# Patient Record
Sex: Male | Born: 1955
Health system: Southern US, Community
[De-identification: ages and names within clinical notes are randomized; demographics above are authoritative.]

## PROBLEM LIST (undated history)

## (undated) DIAGNOSIS — J449 Chronic obstructive pulmonary disease, unspecified: Secondary | ICD-10-CM

## (undated) DIAGNOSIS — K219 Gastro-esophageal reflux disease without esophagitis: Secondary | ICD-10-CM

## (undated) DIAGNOSIS — E785 Hyperlipidemia, unspecified: Secondary | ICD-10-CM

## (undated) HISTORY — DX: Chronic obstructive pulmonary disease, unspecified: J44.9

## (undated) HISTORY — DX: Gastro-esophageal reflux disease without esophagitis: K21.9

## (undated) HISTORY — DX: Hyperlipidemia, unspecified: E78.5

---

## 2000-09-20 ENCOUNTER — Encounter: Payer: Self-pay | Admitting: Family Medicine

## 2000-09-20 ENCOUNTER — Encounter: Admission: RE | Admit: 2000-09-20 | Discharge: 2000-09-20 | Payer: Self-pay | Admitting: Family Medicine

## 2005-08-28 ENCOUNTER — Ambulatory Visit: Payer: Self-pay | Admitting: Family Medicine

## 2006-09-12 ENCOUNTER — Emergency Department (HOSPITAL_COMMUNITY): Admission: EM | Admit: 2006-09-12 | Discharge: 2006-09-12 | Payer: Self-pay | Admitting: Family Medicine

## 2006-10-19 ENCOUNTER — Ambulatory Visit: Payer: Self-pay | Admitting: Family Medicine

## 2007-04-18 ENCOUNTER — Ambulatory Visit: Payer: Self-pay | Admitting: Family Medicine

## 2007-04-18 DIAGNOSIS — G4700 Insomnia, unspecified: Secondary | ICD-10-CM | POA: Insufficient documentation

## 2007-04-18 DIAGNOSIS — E785 Hyperlipidemia, unspecified: Secondary | ICD-10-CM | POA: Insufficient documentation

## 2007-10-13 ENCOUNTER — Emergency Department (HOSPITAL_COMMUNITY): Admission: EM | Admit: 2007-10-13 | Discharge: 2007-10-14 | Payer: Self-pay | Admitting: Infectious Diseases

## 2007-11-23 ENCOUNTER — Ambulatory Visit: Payer: Self-pay | Admitting: Family Medicine

## 2008-09-19 ENCOUNTER — Ambulatory Visit: Payer: Self-pay | Admitting: Family Medicine

## 2009-05-17 ENCOUNTER — Encounter: Payer: Self-pay | Admitting: Family Medicine

## 2010-05-29 NOTE — Medication Information (Signed)
Summary: Lipitor Approved/[facility]  Lipitor Approved/[facility]   Imported By: Sherian Rein 05/22/2009 08:30:58  _____________________________________________________________________  External Attachment:    Type:   Image     Comment:   External Document

## 2010-05-29 NOTE — Assessment & Plan Note (Signed)
Summary: COUGH//CCM   Vital Signs:  Patient profile:   55 year old male Weight:      139 pounds Temp:     98.3 degrees F oral Pulse rate:   87 / minute BP sitting:   94 / 68  (left arm) Cuff size:   regular  Vitals Entered By: Alfred Levins, CMA (Sep 19, 2008 2:23 PM) CC: laryngitis   History of Present Illness: 3 days of stuffy head, PND, ST, and coughing up yellow sputum. No fever.   Allergies (verified): No Known Drug Allergies  Past History:  Past Medical History: Reviewed history from 04/18/2007 and no changes required. Hyperlipidemia  Physical Exam  General:  Well-developed,well-nourished,in no acute distress; alert,appropriate and cooperative throughout examination Head:  Normocephalic and atraumatic without obvious abnormalities. No apparent alopecia or balding. Eyes:  No corneal or conjunctival inflammation noted. EOMI. Perrla. Funduscopic exam benign, without hemorrhages, exudates or papilledema. Vision grossly normal. Ears:  External ear exam shows no significant lesions or deformities.  Otoscopic examination reveals clear canals, tympanic membranes are intact bilaterally without bulging, retraction, inflammation or discharge. Hearing is grossly normal bilaterally. Nose:  External nasal examination shows no deformity or inflammation. Nasal mucosa are pink and moist without lesions or exudates. Mouth:  Oral mucosa and oropharynx without lesions or exudates.  Teeth in good repair. Neck:  No deformities, masses, or tenderness noted. Lungs:  Normal respiratory effort, chest expands symmetrically. Lungs are clear to auscultation, no crackles or wheezes.   Impression & Recommendations:  Problem # 1:  ACUTE BRONCHITIS (ICD-466.0)  His updated medication list for this problem includes:    Zithromax Z-pak 250 Mg Tabs (Azithromycin) .Marland Kitchen... As directed  Complete Medication List: 1)  Zantac 150 Mg Caps (Ranitidine hcl) .... As needed 2)  Levitra 20 Mg Tabs (Vardenafil  hcl) .... As needed 3)  Zithromax Z-pak 250 Mg Tabs (Azithromycin) .... As directed  Patient Instructions: 1)  Please schedule a follow-up appointment as needed .  Prescriptions: ZITHROMAX Z-PAK 250 MG TABS (AZITHROMYCIN) as directed  #1 x 0   Entered and Authorized by:   Nelwyn Salisbury MD   Signed by:   Nelwyn Salisbury MD on 09/19/2008   Method used:   Electronically to        [facility] [address] [phone]* (retail)       [address]       [place], [place]  [address]       Ph: [phone] or [phone]       Fax: [phone]   RxID:   [id number]

## 2010-05-29 NOTE — Assessment & Plan Note (Signed)
Summary: ?cough/njr   Vital Signs:  Patient Profile:   55 Years Old Male Weight:      142 pounds Temp:     98.5 degrees F oral Pulse rate:   86 / minute BP sitting:   110 / 64  (left arm) Cuff size:   regular  Vitals Entered By: Alfred Levins, CMA (November 23, 2007 4:37 PM)                 Chief Complaint:  cough and congestion.  History of Present Illness: Here for one month of HA, sinus pressure, PND, and ST. No fever or cough. Saw the nurse at work a few weeks ago, given Allegra and Amoxicillin. Is no better.    Current Allergies (reviewed today): No known allergies   Past Medical History:    Reviewed history from 04/18/2007 and no changes required:       Hyperlipidemia     Review of Systems      See HPI   Physical Exam  General:     Well-developed,well-nourished,in no acute distress; alert,appropriate and cooperative throughout examination Head:     Normocephalic and atraumatic without obvious abnormalities. No apparent alopecia or balding. Eyes:     No corneal or conjunctival inflammation noted. EOMI. Perrla. Funduscopic exam benign, without hemorrhages, exudates or papilledema. Vision grossly normal. Ears:     External ear exam shows no significant lesions or deformities.  Otoscopic examination reveals clear canals, tympanic membranes are intact bilaterally without bulging, retraction, inflammation or discharge. Hearing is grossly normal bilaterally. Nose:     External nasal examination shows no deformity or inflammation. Nasal mucosa are pink and moist without lesions or exudates. Mouth:     Oral mucosa and oropharynx without lesions or exudates.  Teeth in good repair. Neck:     No deformities, masses, or tenderness noted. Lungs:     Normal respiratory effort, chest expands symmetrically. Lungs are clear to auscultation, no crackles or wheezes.    Impression & Recommendations:  Problem # 1:  OTHER ACUTE SINUSITIS (ICD-461.8)  His updated medication  list for this problem includes:    Levaquin 500 Mg Tabs (Levofloxacin) ..... Once daily  Orders: Depo- Medrol 80mg  (J1040) Admin of Therapeutic Inj  intramuscular or subcutaneous ([address])   Complete Medication List: 1)  Zantac 150 Mg Caps (Ranitidine hcl) .... As needed 2)  Levaquin 500 Mg Tabs (Levofloxacin) .... Once daily 3)  Levitra 20 Mg Tabs (Vardenafil hcl) .... As needed   Patient Instructions: 1)  Please schedule a follow-up appointment as needed.   Prescriptions: LEVITRA 20 MG  TABS (VARDENAFIL HCL) as needed  #10 x 11   Entered and Authorized by:   Nelwyn Salisbury MD   Signed by:   Nelwyn Salisbury MD on 11/23/2007   Method used:   Electronically sent to ...       [facility] [address] [phone]*       [address]       [place], [place]  [address]       Ph: [phone] or [phone]       Fax: [phone]   RxID:   [id number] LEVAQUIN 500 MG  TABS (LEVOFLOXACIN) once daily  #10 x 0   Entered and Authorized by:   Nelwyn Salisbury MD   Signed by:   Nelwyn Salisbury MD on 11/23/2007   Method used:   Electronically sent to .[facility] Kitchen[facility] [address] [phone]*       [address]       [place], [place]  [address]       Ph: [phone] or [phone]       Fax: [phone]   RxID:   [id number]  ]  Medication Administration  Injection # 1:    Medication: Depo- Medrol 80mg     Diagnosis: OTHER ACUTE SINUSITIS (ICD-461.8)    Route: IM    Site: LUOQ gluteus    Exp Date: 9/10    Lot #: [id number]    Mfr: Sicor    Patient tolerated injection without complications    Given by: Alfred Levins, CMA (November 24, 2007 9:23 AM)  Orders Added: 1)  Est. Patient Level III [95638] 2)  Depo- Medrol 80mg  [J1040] 3)  Admin of Therapeutic Inj  intramuscular or subcutaneous [[address]643]

## 2010-06-21 ENCOUNTER — Ambulatory Visit (INDEPENDENT_AMBULATORY_CARE_PROVIDER_SITE_OTHER): Payer: PRIVATE HEALTH INSURANCE | Admitting: Internal Medicine

## 2010-06-21 ENCOUNTER — Encounter: Payer: Self-pay | Admitting: Internal Medicine

## 2010-06-21 DIAGNOSIS — R21 Rash and other nonspecific skin eruption: Secondary | ICD-10-CM

## 2010-06-24 NOTE — Assessment & Plan Note (Signed)
Summary: acute - rash // rs   Vital Signs:  Patient profile:   55 year old male Weight:      135 pounds Temp:     98.8 degrees F oral Pulse rate:   64 / minute Pulse rhythm:   regular BP sitting:   112 / 70  (right arm) Cuff size:   regular  Vitals Entered By: Lowella Petties CMA, AAMA (June 21, 2010 9:31 AM) CC: Itchy rash on legs and lower body x one month.   History of Present Illness: Here with wife  Has had some spots on his legs for a while had in the past very itchy   May have pimples that have some clear liquid come out No sig warmth but occ redness  Works driving forklift Uses regular soap  Had perhaps a year ago but not this bad  Allergies: No Known Drug Allergies  Past History:  Past medical, surgical, family and social histories (including risk factors) reviewed for relevance to current acute and chronic problems.  Past Medical History: Reviewed history from 04/18/2007 and no changes required. Hyperlipidemia  Past Surgical History: Reviewed history from 04/18/2007 and no changes required. Denies surgical history  Family History: Reviewed history from 04/18/2007 and no changes required. Family History Diabetes 1st degree relative Sister committed suicide Brother died of AIDS  Social History: Reviewed history from 04/18/2007 and no changes required. Married Current Smoker Alcohol use-no Drug use-no  Review of Systems       no fever No regular meds  Physical Exam  General:  alert and normal appearance.   Skin:  non specific macular or slightly raised areas on flexor thighs No true discrete lesions   Impression & Recommendations:  Problem # 1:  RASH AND OTHER NONSPECIFIC SKIN ERUPTION (ICD-782.1) Assessment New  seems most likely like neurodermatitis discussed moisturizing soap and creams atarax as needed and steroid cream  His updated medication list for this problem includes:    Triamcinolone Acetonide 0.1 % Crea  (Triamcinolone acetonide) .Marland Kitchen... Apply three times a day as needed for itching  Complete Medication List: 1)  Zantac 150 Mg Caps (Ranitidine hcl) .... As needed 2)  Levitra 20 Mg Tabs (Vardenafil hcl) .... As needed 3)  Hydroxyzine Hcl 25 Mg Tabs (Hydroxyzine hcl) .Marland Kitchen.. 1 tab by mouth three times a day as needed for itching 4)  Triamcinolone Acetonide 0.1 % Crea (Triamcinolone acetonide) .... Apply three times a day as needed for itching  Patient Instructions: 1)  Please use moisturizing soap and then a good cream after showering like cerave or cetaphil 2)  Try the cream and pills for itching (pills are mostly for at bedtime) 3)  Call Dr Clent Ridges if rash not better Prescriptions: TRIAMCINOLONE ACETONIDE 0.1 % CREA (TRIAMCINOLONE ACETONIDE) apply three times a day as needed for itching  #60gm x 0   Entered and Authorized by:   Cindee Salt MD   Signed by:   Cindee Salt MD on 06/21/2010   Method used:   Electronically to        [facility]  [facility] [address] [facility] [address] [phone]* (retail)       [address]       [place], [place]  [address]       Ph: [phone] or [phone]       Fax: [phone]   RxID:   [id number] HYDROXYZINE HCL 25 MG TABS (HYDROXYZINE HCL) 1 tab by mouth three times a  day as needed for itching  #60 x 0   Entered and Authorized by:   Cindee Salt MD   Signed by:   Cindee Salt MD on 06/21/2010   Method used:   Electronically to        [facility]  [facility] [phone]* (retail)       [address]       [place], [place]  [address]       Ph: [phone] or [phone]       Fax: [phone]   RxID:   [id number]    Orders Added: 1)  Est. Patient Level III [63[address]]

## 2010-09-19 ENCOUNTER — Emergency Department (HOSPITAL_COMMUNITY)
Admission: EM | Admit: 2010-09-19 | Discharge: 2010-09-20 | Disposition: A | Discharge status: Home / Self Care | Payer: PRIVATE HEALTH INSURANCE | Attending: Emergency Medicine | Admitting: Emergency Medicine

## 2010-09-19 DIAGNOSIS — L509 Urticaria, unspecified: Secondary | ICD-10-CM | POA: Insufficient documentation

## 2010-09-19 DIAGNOSIS — R21 Rash and other nonspecific skin eruption: Secondary | ICD-10-CM | POA: Insufficient documentation

## 2010-09-24 ENCOUNTER — Ambulatory Visit (INDEPENDENT_AMBULATORY_CARE_PROVIDER_SITE_OTHER): Payer: PRIVATE HEALTH INSURANCE | Admitting: Family Medicine

## 2010-09-24 ENCOUNTER — Encounter: Payer: Self-pay | Admitting: Family Medicine

## 2010-09-24 VITALS — BP 116/82 | HR 88 | Temp 98.1°F | Resp 16 | Wt 135.5 lb

## 2010-09-24 DIAGNOSIS — R21 Rash and other nonspecific skin eruption: Secondary | ICD-10-CM

## 2010-09-24 MED ORDER — PREDNISONE 10 MG PO TABS: 20.0000 mg | ORAL_TABLET | Freq: Two times a day (BID) | ORAL | Status: AC

## 2010-09-24 MED ORDER — TADALAFIL 20 MG PO TABS: 20.0000 mg | ORAL_TABLET | Freq: Every day | ORAL | Status: DC | PRN

## 2010-09-24 NOTE — Progress Notes (Signed)
  Subjective:    Patient ID: Guy Carlson, male    DOB: 02/01/1956, 55 y.o.   MRN: [id number]  HPI Here for 4 months of a diffuse itchy rash over the trunk and legs. He has tried topical steroid creams and Hydroxyzine, but he has had little improvement. No scalp or face involvement.    Review of Systems  Constitutional: Negative.   Skin: Positive for rash.       Objective:   Physical Exam  Constitutional: He appears well-developed and well-nourished.  Skin:       Widespread patches of macular erythema on trunk and legs. No scaling or plaquing.           Assessment & Plan:  Rash of uncertain etiology. Possiblities include psoriasis or lupus. Try oral Prednisone for 2 weeks and we will follow up then. He may need referral to Dermatology

## 2010-09-26 ENCOUNTER — Telehealth: Payer: Self-pay | Admitting: *Deleted

## 2010-09-26 NOTE — Telephone Encounter (Signed)
Tried to return wife's call about patient's rash.  However there is no answering machine at the home number. The patient's cell number does not except messages and the work number is not correct.

## 2011-01-22 LAB — URINALYSIS, ROUTINE W REFLEX MICROSCOPIC
Bilirubin Urine: NEGATIVE
Glucose, UA: NEGATIVE
Hgb urine dipstick: NEGATIVE
Ketones, ur: NEGATIVE
Nitrite: NEGATIVE
Protein, ur: NEGATIVE
Specific Gravity, Urine: 1.018
Urobilinogen, UA: 1
pH: 6.5

## 2011-01-22 LAB — POCT I-STAT, CHEM 8
BUN: 16
Calcium, Ion: 1.12
Chloride: 105
Creatinine, Ser: 1.4
Glucose, Bld: 90
HCT: 41
Hemoglobin: 13.9
Potassium: 3.9
Sodium: 140
TCO2: 26

## 2011-01-22 LAB — POCT CARDIAC MARKERS
CKMB, poc: 1.3
Myoglobin, poc: 174
Operator id: 272551
Troponin i, poc: <0.05

## 2011-06-22 ENCOUNTER — Ambulatory Visit (INDEPENDENT_AMBULATORY_CARE_PROVIDER_SITE_OTHER): Payer: PRIVATE HEALTH INSURANCE | Admitting: Family Medicine

## 2011-06-22 ENCOUNTER — Encounter: Payer: Self-pay | Admitting: Family Medicine

## 2011-06-22 VITALS — BP 110/70 | HR 106 | Temp 99.2°F | Wt 133.0 lb

## 2011-06-22 DIAGNOSIS — J40 Bronchitis, not specified as acute or chronic: Secondary | ICD-10-CM

## 2011-06-22 MED ORDER — BENZONATATE 100 MG PO CAPS: 100.0000 mg | ORAL_CAPSULE | Freq: Four times a day (QID) | ORAL | Status: AC | PRN

## 2011-06-22 MED ORDER — DOXYCYCLINE HYCLATE 100 MG PO CAPS: 100.0000 mg | ORAL_CAPSULE | Freq: Two times a day (BID) | ORAL | Status: DC

## 2011-06-22 NOTE — Progress Notes (Signed)
  Subjective:    Patient ID: Guy Carlson, male    DOB: 01-11-1956, 56 y.o.   MRN: [id number]  HPI Here for one week of stuffy head, PND, and coughing up green sputum.    Review of Systems  Constitutional: Negative.   HENT: Positive for congestion and postnasal drip.   Eyes: Negative.   Respiratory: Positive for cough.        Objective:   Physical Exam  Constitutional: He appears well-developed and well-nourished.  HENT:  Right Ear: External ear normal.  Left Ear: External ear normal.  Nose: Nose normal.  Mouth/Throat: Oropharynx is clear and moist. No oropharyngeal exudate.  Eyes: Conjunctivae are normal.  Neck: No thyromegaly present.  Pulmonary/Chest: Effort normal. He has no wheezes. He has no rales.       Scattered rhonchi   Lymphadenopathy:    He has no cervical adenopathy.          Assessment & Plan:  Add Mucinex

## 2011-06-24 ENCOUNTER — Telehealth: Payer: Self-pay | Admitting: Family Medicine

## 2011-06-24 NOTE — Telephone Encounter (Signed)
Patient states he came in on Monday and has been out of work since and work like a return to work letter so that he may return to work on tomorrow 06/25/11. Please call patient for pick up.

## 2011-06-24 NOTE — Telephone Encounter (Signed)
Pls advise.  

## 2011-06-24 NOTE — Telephone Encounter (Signed)
Please give him such a note  

## 2011-06-25 NOTE — Telephone Encounter (Signed)
Pt called to check on status of getting letter for his work for being out for 3 days. Pt is req that this letter be faxed to his work, [employer] in [employer].  Pts work fax # is [phone] attn Zena Amos. This is the pts supervisor.

## 2011-06-25 NOTE — Telephone Encounter (Signed)
Work note is ready for pick up, tried to call pt, no answer.

## 2012-04-11 ENCOUNTER — Ambulatory Visit (INDEPENDENT_AMBULATORY_CARE_PROVIDER_SITE_OTHER): Payer: PRIVATE HEALTH INSURANCE | Admitting: Family Medicine

## 2012-04-11 ENCOUNTER — Encounter: Payer: Self-pay | Admitting: Family Medicine

## 2012-04-11 VITALS — BP 114/66 | HR 103 | Temp 98.3°F | Wt 130.0 lb

## 2012-04-11 DIAGNOSIS — J329 Chronic sinusitis, unspecified: Secondary | ICD-10-CM

## 2012-04-11 DIAGNOSIS — L84 Corns and callosities: Secondary | ICD-10-CM

## 2012-04-11 DIAGNOSIS — M21619 Bunion of unspecified foot: Secondary | ICD-10-CM

## 2012-04-11 MED ORDER — DOXYCYCLINE HYCLATE 100 MG PO CAPS: 100.0000 mg | ORAL_CAPSULE | Freq: Two times a day (BID) | ORAL | Status: AC

## 2012-04-11 NOTE — Progress Notes (Signed)
  Subjective:    Patient ID: Guy Carlson., male    DOB: 10-Aug-1955, 56 y.o.   MRN: [id number]  HPI Here for 2 things. First he has had a painful lump on the side of his left foot for about a year. He gets a large callous over the area. He usually wears athletic shoes. Also he has had sinus pressure for a week, with HA, PND, and blowing green mucus form the nose. No fever.    Review of Systems  Constitutional: Negative.   HENT: Positive for congestion, postnasal drip and sinus pressure.   Eyes: Negative.   Respiratory: Negative.        Objective:   Physical Exam  Constitutional: He appears well-developed and well-nourished.  HENT:  Left Ear: External ear normal.  Nose: Nose normal.  Mouth/Throat: Oropharynx is clear and moist.  Eyes: Conjunctivae normal are normal.  Pulmonary/Chest: Effort normal and breath sounds normal.  Musculoskeletal:       There is a tender bony lump on the lateral left foot with a large callous over it  Lymphadenopathy:    He has no cervical adenopathy.          Assessment & Plan:  Given Doxycycline for the sinus infection. Use a pumice stone and warm soaks to soften up the callous. We will refer to Podiatry to remove the bunion.

## 2012-06-03 ENCOUNTER — Encounter: Payer: Self-pay | Admitting: Family Medicine

## 2012-06-03 ENCOUNTER — Ambulatory Visit (INDEPENDENT_AMBULATORY_CARE_PROVIDER_SITE_OTHER): Payer: PRIVATE HEALTH INSURANCE | Admitting: Family Medicine

## 2012-06-03 VITALS — BP 120/80 | HR 88 | Temp 98.1°F | Resp 18 | Wt 130.0 lb

## 2012-06-03 DIAGNOSIS — R634 Abnormal weight loss: Secondary | ICD-10-CM

## 2012-06-03 DIAGNOSIS — R109 Unspecified abdominal pain: Secondary | ICD-10-CM

## 2012-06-03 DIAGNOSIS — R0602 Shortness of breath: Secondary | ICD-10-CM

## 2012-06-03 LAB — POCT URINALYSIS DIPSTICK
Bilirubin, UA: NEGATIVE
Blood, UA: NEGATIVE
Glucose, UA: NEGATIVE
Ketones, UA: NEGATIVE
Leukocytes, UA: NEGATIVE
Nitrite, UA: NEGATIVE
Protein, UA: NEGATIVE
Spec Grav, UA: 1.02
Urobilinogen, UA: 0.2
pH, UA: 7

## 2012-06-03 LAB — CBC WITH DIFFERENTIAL/PLATELET
Basophils Absolute: 0.1 10*3/uL (ref 0.0–0.1)
Basophils Relative: 1 % (ref 0–1)
Eosinophils Absolute: 0.3 10*3/uL (ref 0.0–0.7)
Eosinophils Relative: 4 % (ref 0–5)
HCT: 36.7 % — ABNORMAL LOW (ref 39.0–52.0)
Hemoglobin: 12 g/dL — ABNORMAL LOW (ref 13.0–17.0)
Lymphocytes Relative: 30 % (ref 12–46)
Lymphs Abs: 2.1 10*3/uL (ref 0.7–4.0)
MCH: 30.1 pg (ref 26.0–34.0)
MCHC: 32.7 g/dL (ref 30.0–36.0)
MCV: 92 fL (ref 78.0–100.0)
Monocytes Absolute: 0.4 10*3/uL (ref 0.1–1.0)
Monocytes Relative: 6 % (ref 3–12)
Neutro Abs: 4 10*3/uL (ref 1.7–7.7)
Neutrophils Relative %: 59 % (ref 43–77)
Platelets: 203 10*3/uL (ref 150–400)
RBC: 3.99 MIL/uL — ABNORMAL LOW (ref 4.22–5.81)
RDW: 13.2 % (ref 11.5–15.5)
WBC: 6.8 10*3/uL (ref 4.0–10.5)

## 2012-06-03 LAB — BASIC METABOLIC PANEL
BUN: 9 mg/dL (ref 6–23)
CO2: 31 mEq/L (ref 19–32)
Calcium: 9.4 mg/dL (ref 8.4–10.5)
Chloride: 106 mEq/L (ref 96–112)
Creat: 0.82 mg/dL (ref 0.50–1.35)
Glucose, Bld: 92 mg/dL (ref 70–99)
Potassium: 4.1 mEq/L (ref 3.5–5.3)
Sodium: 140 mEq/L (ref 135–145)

## 2012-06-03 LAB — HEPATIC FUNCTION PANEL
ALT: 11 U/L (ref 0–53)
AST: 17 U/L (ref 0–37)
Albumin: 3.9 g/dL (ref 3.5–5.2)
Alkaline Phosphatase: 63 U/L (ref 39–117)
Bilirubin, Direct: 0.1 mg/dL (ref 0.0–0.3)
Indirect Bilirubin: 0.3 mg/dL (ref 0.0–0.9)
Total Bilirubin: 0.4 mg/dL (ref 0.3–1.2)
Total Protein: 6.2 g/dL (ref 6.0–8.3)

## 2012-06-03 LAB — TSH: TSH: 2.011 u[IU]/mL (ref 0.350–4.500)

## 2012-06-03 LAB — PSA: PSA: 0.76 ng/mL (ref <=4.00)

## 2012-06-03 NOTE — Progress Notes (Signed)
  Subjective:    Patient ID: Guy Koskela., male    DOB: 06-07-1955, 57 y.o.   MRN: [id number]  HPI Here with his wife for a number of concerns. He as usual downplays everything but his wife is very worried. He has lost weight in the past year without trying. He continues to smoke between 1 and 2 ppd of cigarettes. He denies using any alcohol for many years. He has had frequent diarrhea over the last month or so. He has transient abdominal pains and nausea, though he has not vomited. He has night sweats where he soaks the bed with sweat. He has a chronic dry cough and mild SOB. He has never seen blood or black in his stools. His appetite is poor. He averages 3-4 hours of sleep a night saying he doesn't feel sleepy. He has not had much medical surveillance for years.    Review of Systems  Constitutional: Positive for chills, diaphoresis, appetite change and unexpected weight change. Negative for activity change.  Eyes: Negative.   Respiratory: Positive for cough, chest tightness, shortness of breath and wheezing.   Cardiovascular: Negative.   Gastrointestinal: Positive for nausea, abdominal pain and diarrhea. Negative for vomiting, constipation, blood in stool, abdominal distention and rectal pain.  Genitourinary: Negative.   Neurological: Negative.        Objective:   Physical Exam  Constitutional: He is oriented to person, place, and time. No distress.       Quite thin   Eyes: Conjunctivae normal are normal. Pupils are equal, round, and reactive to light. No scleral icterus.  Neck: No thyromegaly present.  Cardiovascular: Normal rate, regular rhythm, normal heart sounds and intact distal pulses.   Pulmonary/Chest: Effort normal. No respiratory distress. He has no rales.       Scattered rhonchi and wheezes   Abdominal: Soft. Bowel sounds are normal. He exhibits no distension and no mass. There is no tenderness. There is no rebound and no guarding.  Musculoskeletal: He exhibits  no edema.  Lymphadenopathy:    He has no cervical adenopathy.  Neurological: He is alert and oriented to person, place, and time.          Assessment & Plan:  Unexplained weight loss, night sweats, abdominal pains and diarrhea. Possible etiologies include peptic ulcers, coilitis, neoplasm, etc. Get a CXR and basic labs today. Set up referral to GI for possible upper and lower endoscopy. I strongly urged him to quit smoking.

## 2012-06-06 ENCOUNTER — Ambulatory Visit (INDEPENDENT_AMBULATORY_CARE_PROVIDER_SITE_OTHER)
Admission: RE | Admit: 2012-06-06 | Discharge: 2012-06-06 | Disposition: A | Discharge status: Home / Self Care | Payer: PRIVATE HEALTH INSURANCE | Source: Ambulatory Visit | Attending: Family Medicine | Admitting: Family Medicine

## 2012-06-06 ENCOUNTER — Encounter: Payer: Self-pay | Admitting: *Deleted

## 2012-06-06 DIAGNOSIS — R0602 Shortness of breath: Secondary | ICD-10-CM

## 2012-06-06 NOTE — Progress Notes (Signed)
Quick Note:  I left voice message for pt to return my call to get results. ______

## 2012-06-07 ENCOUNTER — Telehealth: Payer: Self-pay | Admitting: Family Medicine

## 2012-06-07 NOTE — Telephone Encounter (Signed)
Pt wife is requesting xray results. Ok to leave on vm

## 2012-06-07 NOTE — Telephone Encounter (Signed)
I did call and leave results on voice mail.

## 2012-06-07 NOTE — Progress Notes (Signed)
Quick Note:  I left voice message with results. ______ 

## 2012-06-07 NOTE — Progress Notes (Signed)
Quick Note:  Per pt, we can leave the results on voice mail, which I did. ______

## 2012-06-11 ENCOUNTER — Other Ambulatory Visit: Payer: Self-pay

## 2012-06-20 ENCOUNTER — Encounter: Payer: Self-pay | Admitting: Gastroenterology

## 2012-07-07 ENCOUNTER — Ambulatory Visit (INDEPENDENT_AMBULATORY_CARE_PROVIDER_SITE_OTHER): Payer: PRIVATE HEALTH INSURANCE | Admitting: Gastroenterology

## 2012-07-07 ENCOUNTER — Encounter: Payer: Self-pay | Admitting: Gastroenterology

## 2012-07-07 VITALS — BP 108/62 | HR 68 | Ht 71.0 in | Wt 138.0 lb

## 2012-07-07 DIAGNOSIS — R634 Abnormal weight loss: Secondary | ICD-10-CM

## 2012-07-07 DIAGNOSIS — D649 Anemia, unspecified: Secondary | ICD-10-CM

## 2012-07-07 MED ORDER — PEG-KCL-NACL-NASULF-NA ASC-C 100 G PO SOLR: 1.0000 | Freq: Once | ORAL | Status: DC

## 2012-07-07 NOTE — Patient Instructions (Addendum)
You have been scheduled for an endoscopy and colonoscopy with propofol. Please follow the written instructions given to you at your visit today. Please pick up your prep at the pharmacy within the next 1-3 days. If you use inhalers (even only as needed), please bring them with you on the day of your procedure.  Follow instructions on Hemoccult cards and mail them back to [place] when finished.   Thank you for choosing me and [facility].  Venita Lick. Pleas Koch., MD., Clementeen Graham

## 2012-07-07 NOTE — Progress Notes (Signed)
History of Present Illness: This is a 57 year old male accompanied by his wife. He has had about a 7 or 8 pound weight loss over the past several months with a decreased appetite. He was recently diagnosed with COPD and quit cigarette smoking 2-3 weeks ago. He states his appetite has improved since he quit smoking and has gained several pounds. He relates some slightly loose stools and mild abdominal discomfort that occurred for a few days several weeks ago but he currently denies any gastrointestinal complaints. He was noted to have a mild normocytic anemia at his last office visit with Dr. Clent Ridges and he was placed on iron supplements. An upper GI series performed in 2002 revealed duodenal bulb deformity consistent with prior ulcer disease and possible gastritis noted to be thickened gastric folds. Denies abdominal pain, constipation, diarrhea, change in stool caliber, melena, hematochezia, nausea, vomiting, dysphagia, reflux symptoms, chest pain.  Review of Systems: Pertinent positive and negative review of systems were noted in the above HPI section. All other review of systems were otherwise negative.  Current Medications, Allergies, Past Medical History, Past Surgical History, Family History and Social History were reviewed in [employer] record.  Physical Exam: General: Well developed , well nourished, no acute distress Head: Normocephalic and atraumatic Eyes:  sclerae anicteric, EOMI Ears: Normal auditory acuity Mouth: No deformity or lesions Neck: Supple, no masses or thyromegaly Lungs: Clear throughout to auscultation Heart: Regular rate and rhythm; no murmurs, rubs or bruits Abdomen: Soft, non tender and non distended. No masses, hepatosplenomegaly or hernias noted. Normal Bowel sounds Rectal: Deferred to colonoscopy Musculoskeletal: Symmetrical with no gross deformities  Skin: No lesions on visible extremities Pulses:  Normal pulses noted Extremities: No clubbing,  cyanosis, edema or deformities noted Neurological: Alert oriented x 4, grossly nonfocal Cervical Nodes:  No significant cervical adenopathy Inguinal Nodes: No significant inguinal adenopathy Psychological:  Alert and cooperative. Normal mood and affect  Assessment and Recommendations:  1. Weight loss, change in appetite, unexplained anemia, possible history of ulcer disease. Rule out colorectal neoplasm, ulcer and other gastrointestinal disorders. Obtain stool Hemoccults. Schedule colonoscopy and upper endoscopy. The risks, benefits, and alternatives to endoscopy with possible biopsy and possible dilation were discussed with the patient and they consent to proceed. The risks, benefits, and alternatives to colonoscopy with possible biopsy and possible polypectomy were discussed with the patient and they consent to proceed.

## 2012-07-18 ENCOUNTER — Encounter: Payer: Self-pay | Admitting: Gastroenterology

## 2012-08-02 ENCOUNTER — Other Ambulatory Visit: Payer: Self-pay | Admitting: Gastroenterology

## 2012-08-02 ENCOUNTER — Encounter: Payer: Self-pay | Admitting: Gastroenterology

## 2012-08-02 ENCOUNTER — Ambulatory Visit (AMBULATORY_SURGERY_CENTER): Payer: PRIVATE HEALTH INSURANCE | Admitting: Gastroenterology

## 2012-08-02 VITALS — BP 101/71 | HR 75 | Temp 98.2°F | Resp 18 | Ht 71.0 in | Wt 138.0 lb

## 2012-08-02 DIAGNOSIS — R634 Abnormal weight loss: Secondary | ICD-10-CM

## 2012-08-02 DIAGNOSIS — D649 Anemia, unspecified: Secondary | ICD-10-CM

## 2012-08-02 DIAGNOSIS — K2980 Duodenitis without bleeding: Secondary | ICD-10-CM

## 2012-08-02 DIAGNOSIS — D126 Benign neoplasm of colon, unspecified: Secondary | ICD-10-CM

## 2012-08-02 MED ORDER — OMEPRAZOLE 40 MG PO CPDR: 40.0000 mg | DELAYED_RELEASE_CAPSULE | Freq: Every day | ORAL | Status: DC

## 2012-08-02 MED ORDER — SODIUM CHLORIDE 0.9 % IV SOLN: 500.0000 mL | INTRAVENOUS | Status: DC

## 2012-08-02 NOTE — Progress Notes (Signed)
Called to room to assist during endoscopic procedure.  Patient ID and intended procedure confirmed with present staff. Received instructions for my participation in the procedure from the performing physician.  

## 2012-08-02 NOTE — Op Note (Signed)
[facility] [address] [place], [address]   COLONOSCOPY PROCEDURE REPORT  PATIENT: Guy Carlson, Guy Carlson  MR#: [id number] BIRTHDATE: 1955/06/29 , 56  yrs. old GENDER: Male ENDOSCOPIST: Meryl Dare, MD, [facility] REFERRED XB:[id number] Marguerita Beards, M.D. PROCEDURE DATE:  08/02/2012 PROCEDURE:   Colonoscopy with snare polypectomy ASA CLASS:   Class II INDICATIONS:Anemia, non-specific and weight loss. MEDICATIONS: MAC sedation, administered by CRNA and propofol (Diprivan) 200mg  IV DESCRIPTION OF PROCEDURE:   After the risks benefits and alternatives of the procedure were thoroughly explained, informed consent was obtained.  A digital rectal exam revealed no abnormalities of the rectum.   The LB [id number]  endoscope was introduced through the anus and advanced to the cecum, which was identified by both the appendix and ileocecal valve. No adverse events experienced.   The quality of the prep was good, using MoviPrep  The instrument was then slowly withdrawn as the colon was fully examined.  COLON FINDINGS: 4 mm  non-bleeding angiodysplastic lesion was found at the cecum.   A pedunculated polyp measuring 1 cm in size was found in the sigmoid colon.  A polypectomy was performed using snare cautery.  The resection was complete and the polyp tissue was completely retrieved.   Moderate diverticulosis was noted in the sigmoid colon and descending colon.   The colon was otherwise normal.  There was no diverticulosis, inflammation, polyps or cancers unless previously stated.  Retroflexed views revealed small internal hemorrhoids. The time to cecum=3 minutes 20 seconds. Withdrawal time=10 minutes 14 seconds.  The scope was withdrawn and the procedure completed. COMPLICATIONS: There were no complications.  ENDOSCOPIC IMPRESSION: 1.   4mm angiodysplastic lesion and at the cecum 2.   Pedunculated polyp measuring 1 cm in the sigmoid colon: polypectomy performed using  snare cautery 3.   Moderate diverticulosis was noted in the sigmoid colon and descending colon 4.   Small internal hemorrhoids  RECOMMENDATIONS: 1.  Hold aspirin, aspirin products, and anti-inflammatory medication for 2 weeks. 2.  Await pathology results 3.  High fiber diet with liberal fluid intake. 4.  Repeat colonoscopy in 5 years if polyp adenomatous; otherwise 10 years   eSigned:  Meryl Dare, MD, [facility] 08/02/2012 2:33 PM      PATIENT NAME:  [name], [name] MR#: [id number]

## 2012-08-02 NOTE — Patient Instructions (Addendum)
YOU HAD AN ENDOSCOPIC PROCEDURE TODAY AT THE [facility]: Refer to the procedure report that was given to you for any specific questions about what was found during the examination.  If the procedure report does not answer your questions, please call your gastroenterologist to clarify.  If you requested that your care partner not be given the details of your procedure findings, then the procedure report has been included in a sealed envelope for you to review at your convenience later.  YOU SHOULD EXPECT: Some feelings of bloating in the abdomen. Passage of more gas than usual.  Walking can help get rid of the air that was put into your GI tract during the procedure and reduce the bloating. If you had a lower endoscopy (such as a colonoscopy or flexible sigmoidoscopy) you may notice spotting of blood in your stool or on the toilet paper. If you underwent a bowel prep for your procedure, then you may not have a normal bowel movement for a few days.  DIET: Your first meal following the procedure should be a light meal and then it is ok to progress to your normal diet.  A half-sandwich or bowl of soup is an example of a good first meal.  Heavy or fried foods are harder to digest and may make you feel nauseous or bloated.  Likewise meals heavy in dairy and vegetables can cause extra gas to form and this can also increase the bloating.  Drink plenty of fluids but you should avoid alcoholic beverages for 24 hours.  ACTIVITY: Your care partner should take you home directly after the procedure.  You should plan to take it easy, moving slowly for the rest of the day.  You can resume normal activity the day after the procedure however you should NOT DRIVE or use heavy machinery for 24 hours (because of the sedation medicines used during the test).    SYMPTOMS TO REPORT IMMEDIATELY: A gastroenterologist can be reached at any hour.  During normal business hours, 8:30 AM to 5:00 PM Monday through Friday,  call [phone].  After hours and on weekends, please call the GI answering service at [phone] who will take a message and have the physician on call contact you.   Following lower endoscopy (colonoscopy or flexible sigmoidoscopy):  Excessive amounts of blood in the stool  Significant tenderness or worsening of abdominal pains  Swelling of the abdomen that is new, acute  Fever of 100F or higher  Following upper endoscopy (EGD)  Vomiting of blood or coffee ground material  New chest pain or pain under the shoulder blades  Painful or persistently difficult swallowing  New shortness of breath  Fever of 100F or higher  Black, tarry-looking stools  FOLLOW UP: If any biopsies were taken you will be contacted by phone or by letter within the next 1-3 weeks.  Call your gastroenterologist if you have not heard about the biopsies in 3 weeks.  Our staff will call the home number listed on your records the next business day following your procedure to check on you and address any questions or concerns that you may have at that time regarding the information given to you following your procedure. This is a courtesy call and so if there is no answer at the home number and we have not heard from you through the emergency physician on call, we will assume that you have returned to your regular daily activities without incident.  SIGNATURES/CONFIDENTIALITY: You and/or your care   partner have signed paperwork which will be entered into your electronic medical record.  These signatures attest to the fact that that the information above on your After Visit Summary has been reviewed and is understood.  Full responsibility of the confidentiality of this discharge information lies with you and/or your care-partner.  

## 2012-08-02 NOTE — Progress Notes (Signed)
Patient did not experience any of the following events: a burn prior to discharge; a fall within the facility; wrong site/side/patient/procedure/implant event; or a hospital transfer or hospital admission upon discharge from the facility. (G8907) Patient did not have preoperative order for IV antibiotic SSI prophylaxis. (G8918)  

## 2012-08-02 NOTE — Op Note (Signed)
[facility] [address] [place], [address]   ENDOSCOPY PROCEDURE REPORT  PATIENT: Guy Carlson, Guy Carlson  MR#: [id number] BIRTHDATE: 08-Mar-1956 , 56  yrs. old GENDER: Male ENDOSCOPIST: Meryl Dare, MD, [facility] REFERRED BY:  Nelwyn Salisbury, M.D. PROCEDURE DATE:  08/02/2012 PROCEDURE:  EGD w/ biopsy ASA CLASS:     Class II INDICATIONS:  Anemia.   Weight loss. MEDICATIONS: residual sedation effect from prior procedure, MAC sedation, administered by CRNA, propofol (Diprivan) 100mg  IV TOPICAL ANESTHETIC: none DESCRIPTION OF PROCEDURE: After the risks benefits and alternatives of the procedure were thoroughly explained, informed consent was obtained.  The LB GIF-H180 [id number] endoscope was introduced through the mouth and advanced to the second portion of the duodenum without limitations.  The instrument was slowly withdrawn as the mucosa was fully examined.  DUODENUM: Moderate duodenal inflammation with erosions and deformity was found in the duodenal bulb.   The duodenal mucosa showed no abnormalities in the 2nd part of the duodenum. STOMACH: The mucosa and folds of the stomach appeared normal. ESOPHAGUS: The mucosa of the esophagus appeared normal.  Retroflexed views revealed no abnormalities.  The scope was then withdrawn from the patient and the procedure completed.  COMPLICATIONS: There were no complications.  ENDOSCOPIC IMPRESSION: 1.   Duodenitis, erosive 2.   Duodenal bulb deformity  RECOMMENDATIONS: 1.  Await pathology results 2.  PPI qam: omeprazole 40 mg po qam, 1 year of refills 3.  DC ranitidine   eSigned:  Meryl Dare, MD, [facility] 08/02/2012 2:43 PM

## 2012-08-02 NOTE — Progress Notes (Signed)
Lidocaine-40mg IV prior to Propofol InductionPropofol given over incremental dosages 

## 2012-08-03 ENCOUNTER — Telehealth: Payer: Self-pay | Admitting: *Deleted

## 2012-08-03 NOTE — Telephone Encounter (Signed)
Ring no answer, no machine

## 2012-08-09 ENCOUNTER — Encounter: Payer: Self-pay | Admitting: Gastroenterology

## 2012-12-21 ENCOUNTER — Ambulatory Visit (INDEPENDENT_AMBULATORY_CARE_PROVIDER_SITE_OTHER): Payer: PRIVATE HEALTH INSURANCE | Admitting: Family Medicine

## 2012-12-21 ENCOUNTER — Encounter: Payer: Self-pay | Admitting: Family Medicine

## 2012-12-21 VITALS — BP 130/72 | HR 107 | Temp 98.2°F | Wt 140.0 lb

## 2012-12-21 DIAGNOSIS — N529 Male erectile dysfunction, unspecified: Secondary | ICD-10-CM

## 2012-12-21 MED ORDER — TADALAFIL 20 MG PO TABS: 20.0000 mg | ORAL_TABLET | Freq: Every day | ORAL | Status: DC | PRN

## 2012-12-21 NOTE — Progress Notes (Signed)
  Subjective:    Patient ID: Guy RANDLE SR., male    DOB: Jan 31, 1956, 57 y.o.   MRN: [id number]  HPI Here to discuss erectile issues. He has been trying Levitra lately with poor results. He thinks Cialis may have worked better so he wants to try this again.    Review of Systems  Constitutional: Negative.        Objective:   Physical Exam  Constitutional: He is oriented to person, place, and time. He appears well-developed and well-nourished.  Cardiovascular: Normal rate, regular rhythm, normal heart sounds and intact distal pulses.   Pulmonary/Chest: Effort normal and breath sounds normal.  Neurological: He is alert and oriented to person, place, and time.          Assessment & Plan:  Try Cialis 20 mg.

## 2013-03-02 ENCOUNTER — Other Ambulatory Visit: Payer: Self-pay

## 2013-05-04 ENCOUNTER — Encounter: Payer: Self-pay | Admitting: Family Medicine

## 2013-05-04 ENCOUNTER — Ambulatory Visit (INDEPENDENT_AMBULATORY_CARE_PROVIDER_SITE_OTHER): Payer: 59 | Admitting: Family Medicine

## 2013-05-04 VITALS — BP 126/80 | HR 106 | Temp 98.4°F | Wt 140.0 lb

## 2013-05-04 DIAGNOSIS — J019 Acute sinusitis, unspecified: Secondary | ICD-10-CM

## 2013-05-04 MED ORDER — AZITHROMYCIN 250 MG PO TABS: ORAL_TABLET | ORAL | Status: DC

## 2013-05-04 NOTE — Progress Notes (Signed)
   Subjective:    Patient ID: Guy COCUZZA SR., male    DOB: June 22, 1955, 58 y.o.   MRN: [id number]  HPI Here for 3 days of sinus pressure, PND, ST and a dry cough. On Mucincex.    Review of Systems  Constitutional: Negative.   HENT: Positive for congestion, postnasal drip and sinus pressure.   Eyes: Negative.   Respiratory: Positive for cough.        Objective:   Physical Exam  Constitutional: He appears well-developed and well-nourished.  HENT:  Right Ear: External ear normal.  Left Ear: External ear normal.  Nose: Nose normal.  Mouth/Throat: Oropharynx is clear and moist.  Eyes: Conjunctivae are normal.  Pulmonary/Chest: Effort normal and breath sounds normal.  Lymphadenopathy:    He has no cervical adenopathy.          Assessment & Plan:  Drink fluids. Out of work from 05-03-13 until 05-08-13.

## 2013-05-04 NOTE — Progress Notes (Signed)
Pre visit review using our clinic review tool, if applicable. No additional management support is needed unless otherwise documented below in the visit note. 

## 2014-03-26 ENCOUNTER — Telehealth: Payer: Self-pay

## 2014-03-26 MED ORDER — TADALAFIL 20 MG PO TABS: 20.0000 mg | ORAL_TABLET | Freq: Every day | ORAL | Status: DC | PRN

## 2014-03-26 NOTE — Telephone Encounter (Signed)
Pt called requesting a refill of Cialis 20 mg-  Please send to [place].

## 2014-03-26 NOTE — Telephone Encounter (Signed)
done

## 2014-03-27 ENCOUNTER — Encounter: Payer: Self-pay | Admitting: Family Medicine

## 2014-04-11 ENCOUNTER — Encounter: Payer: Self-pay | Admitting: Family Medicine

## 2014-04-11 ENCOUNTER — Ambulatory Visit (INDEPENDENT_AMBULATORY_CARE_PROVIDER_SITE_OTHER): Payer: 59 | Admitting: Family Medicine

## 2014-04-11 VITALS — BP 112/67 | HR 76 | Temp 98.2°F | Ht 71.0 in | Wt 144.0 lb

## 2014-04-11 DIAGNOSIS — N529 Male erectile dysfunction, unspecified: Secondary | ICD-10-CM | POA: Insufficient documentation

## 2014-04-11 DIAGNOSIS — K219 Gastro-esophageal reflux disease without esophagitis: Secondary | ICD-10-CM

## 2014-04-11 DIAGNOSIS — N528 Other male erectile dysfunction: Secondary | ICD-10-CM

## 2014-04-11 NOTE — Progress Notes (Signed)
Pre visit review using our clinic review tool, if applicable. No additional management support is needed unless otherwise documented below in the visit note. 

## 2014-04-11 NOTE — Progress Notes (Signed)
   Subjective:    Patient ID: Guy LLANAS SR., male    DOB: 08-06-1955, 58 y.o.   MRN: [id number]  HPI Here to follow up on GERD and erectile dysfunction. His GERD is well controlled but he does have to watch his diet. Cialis works well.    Review of Systems  Constitutional: Negative.   Respiratory: Negative.   Cardiovascular: Negative.   Gastrointestinal: Negative.        Objective:   Physical Exam  Constitutional: He appears well-developed and well-nourished.  Cardiovascular: Normal rate, regular rhythm, normal heart sounds and intact distal pulses.   Pulmonary/Chest: Effort normal and breath sounds normal.  Abdominal: Soft. Bowel sounds are normal. He exhibits no distension and no mass. There is no tenderness. There is no rebound and no guarding.          Assessment & Plan:  He is doing well. Recheck prn

## 2014-06-14 ENCOUNTER — Ambulatory Visit (INDEPENDENT_AMBULATORY_CARE_PROVIDER_SITE_OTHER): Payer: 59 | Admitting: Family Medicine

## 2014-06-14 ENCOUNTER — Encounter: Payer: Self-pay | Admitting: Family Medicine

## 2014-06-14 VITALS — BP 120/70 | HR 89 | Temp 98.2°F | Wt 144.0 lb

## 2014-06-14 DIAGNOSIS — A499 Bacterial infection, unspecified: Secondary | ICD-10-CM

## 2014-06-14 DIAGNOSIS — H109 Unspecified conjunctivitis: Secondary | ICD-10-CM

## 2014-06-14 DIAGNOSIS — H1089 Other conjunctivitis: Secondary | ICD-10-CM

## 2014-06-14 MED ORDER — TOBRAMYCIN 0.3 % OP SOLN: 2.0000 [drp] | OPHTHALMIC | Status: DC

## 2014-06-14 NOTE — Progress Notes (Signed)
   Subjective:    Patient ID: Guy PEDLEY SR., male    DOB: 03-06-1956, 59 y.o.   MRN: [id number]  HPI Acute visit for right eye redness. Started yesterday. Several coworkers recently with conjunctivitis. He had some crusting this morning. Denies any eye pain. Slight itching. No blurred vision. No known injury. Denies any sinusitis symptoms. He used warm compresses which helped this morning.  Past Medical History  Diagnosis Date  . Hyperlipidemia   . COPD (chronic obstructive pulmonary disease)    No past surgical history on file.  reports that he quit smoking about 1 years ago. His smoking use included Cigarettes. He smoked 1.00 pack per day. He has never used smokeless tobacco. He reports that he does not drink alcohol or use illicit drugs. family history includes Breast cancer in his maternal aunt; Diabetes in his father and mother; HIV in his brother; Heart disease in his maternal aunt and paternal aunt; Kidney disease in his father; Mental illness in his sister; Ovarian cancer in his sister. No Known Allergies    Review of Systems  Constitutional: Negative for fever and chills.  Eyes: Positive for discharge, redness and itching. Negative for pain and visual disturbance.       Objective:   Physical Exam  Constitutional: He appears well-developed and well-nourished.  Eyes:  Right conjunctiva is very erythematous. He has significant erythema of the right eye pupil is normal. No corneal defects. No foreign bodies noted. Left eye appears normal  Cardiovascular: Normal rate.           Assessment & Plan:  Bacterial conjunctivitis right eye. Continue warm compresses. Tobrex eyedrops every 2-4 hours while awake. Follow-up as needed

## 2014-06-14 NOTE — Patient Instructions (Signed)

## 2014-06-14 NOTE — Progress Notes (Signed)
Pre visit review using our clinic review tool, if applicable. No additional management support is needed unless otherwise documented below in the visit note. 

## 2015-02-08 ENCOUNTER — Encounter: Payer: Self-pay | Admitting: Internal Medicine

## 2015-02-08 ENCOUNTER — Ambulatory Visit (INDEPENDENT_AMBULATORY_CARE_PROVIDER_SITE_OTHER): Payer: 59 | Admitting: Internal Medicine

## 2015-02-08 VITALS — BP 114/66 | HR 92 | Temp 98.9°F | Wt 140.9 lb

## 2015-02-08 DIAGNOSIS — J449 Chronic obstructive pulmonary disease, unspecified: Secondary | ICD-10-CM | POA: Diagnosis not present

## 2015-02-08 DIAGNOSIS — J988 Other specified respiratory disorders: Secondary | ICD-10-CM

## 2015-02-08 DIAGNOSIS — J22 Unspecified acute lower respiratory infection: Secondary | ICD-10-CM

## 2015-02-08 MED ORDER — DOXYCYCLINE HYCLATE 100 MG PO TABS: 100.0000 mg | ORAL_TABLET | Freq: Two times a day (BID) | ORAL | Status: DC

## 2015-02-08 NOTE — Progress Notes (Signed)
Pre visit review using our clinic review tool, if applicable. No additional management support is needed unless otherwise documented below in the visit note.  Chief Complaint  Patient presents with  . Fatigue  . Chills  . Cough  . Headache    HPI: Patient Guy MATUSEK SR.  comes in today for SDA for  new problem evaluation. PCP NA  Onset this week cough and brought today . Because his been in bed for 2 days. Feels a little better right now taking Robitiussin.   And  Antibiotic   Left over from sinu infection.   he was treated for from work about 3 months ago. Doesn't know the name of the medicine. Denies increasing shortness of breath hemoptysis but coughing badly and get a stomachache from it.   Sweating  Cough so much stomach hurts.   Phlegm but clear  No infected .   No fever  .   ocass sob no hemoptysis . sletp ok .  uncertain if he has a fever a little bit chilled and body aches able to take in fluids. No diarrhea. Was supposed to work today but stayed home.  Job :  [employer].    2 days ago .  Exposed to dust history of smoking for many years has stop for 2 years.  ROS: See pertinent positives and negatives per HPI. no chest pain active wheezing hemoptysis see above.   states that the last time he had a flu shot he got the flu.  Past Medical History  Diagnosis Date  . Hyperlipidemia   . COPD (chronic obstructive pulmonary disease) (HCC)     Family History  Problem Relation Age of Onset  . Diabetes Father   . Kidney disease Father   . Mental illness Sister     committed suicide  . HIV Brother     died of AIDS  . Diabetes Mother   . Breast cancer Maternal Aunt     x2  . Ovarian cancer Sister   . Heart disease Maternal Aunt   . Heart disease Paternal Aunt     Social History   Social History  . Marital Status: Married    Spouse Name: N/A  . Number of Children: 3  . Years of Education: N/A   Occupational History  .  [employer]    Social History Main Topics  . Smoking status: Former Smoker -- 1.00 packs/day    Types: Cigarettes    Quit date: 06/16/2012  . Smokeless tobacco: Never Used  . Alcohol Use: No  . Drug Use: No  . Sexual Activity: Not Asked   Other Topics Concern  . None   Social History Narrative    Outpatient Prescriptions Prior to Visit  Medication Sig Dispense Refill  . Ferrous Sulfate (IRON CR PO) Take 1 each by mouth daily.      . ranitidine (ZANTAC) 150 MG capsule Take 150 mg by mouth as needed.     . tadalafil (CIALIS) 20 MG tablet Take 1 tablet (20 mg total) by mouth daily as needed for erectile dysfunction. 10 tablet 11  . tobramycin (TOBREX) 0.3 % ophthalmic solution Place 2 drops into the right eye every 4 (four) hours. 5 mL 0   No facility-administered medications prior to visit.     EXAM:  BP 114/66 mmHg  Pulse 92  Temp(Src) 98.9 F (37.2 C) (Oral)  Wt 140 lb 14.4 oz (63.912 kg)  SpO2 97%  Body mass index  is 19.66 kg/(m^2).  GENERAL: vitals reviewed and listed above, alert, oriented, appears well hydrated and in no acute distress nontoxic minimally ill deep bronchial cough normal respirations HEENT: atraumatic, conjunctiva  clear, no obvious abnormalities on inspection of external nose and ears  TMs clear nares clear rhinorrheaOP : no lesion edema or exudate  NECK: no obvious masses on inspection palpation  LUNGS: clear to auscultation bilaterally, no wheezes, rales or rhonchi,  air movement seems equal  CV: HRRR, no clubbing cyanosis or  peripheral edema nl cap refill  MS: moves all extremities without noticeable focal  abnormality PSYCH: pleasant and cooperative, no obvious depression or anxiety  ASSESSMENT AND PLAN:  Discussed the following assessment and plan:  Acute respiratory infection  Chronic obstructive pulmonary disease, unspecified COPD type (HCC)  at this point seems like a viral respiratory infection but he is at risk with his history of lung disease.  Symptomatic treatment over the weekend note given for work if increasing symptoms signs of bacterial infection as discussed with patient he can add the antibiotic and follow-up. Discussed how to use antibiotics don't sprinkle them if treating him treating taking regularly. Can use OTC cough medicine for comfort. Reinforced continued tobacco free state.  -Patient advised to return or notify health care team  if symptoms worsen ,persist or new concerns arise.  Patient Instructions  This is a respiratory infection .  That may be virus and r n its course .   However because you have copd  If phlegm  Turns infected.  the can add antibiotic  . If fever feeling continues over weekend then can  Fu with dr Sarajane Jews next week.    Standley Brooking. Panosh M.D.

## 2015-02-08 NOTE — Patient Instructions (Signed)
This is a respiratory infection .  That may be virus and r n its course .   However because you have copd  If phlegm  Turns infected.  the can add antibiotic  . If fever feeling continues over weekend then can  Fu with dr Sarajane Jews next week.

## 2015-12-31 ENCOUNTER — Telehealth: Payer: Self-pay | Admitting: Family Medicine

## 2015-12-31 NOTE — Telephone Encounter (Signed)
Pt would like to go to [facility] for his cpx labs work

## 2016-01-03 ENCOUNTER — Other Ambulatory Visit: Payer: Self-pay | Admitting: Family Medicine

## 2016-01-03 DIAGNOSIS — Z0000 Encounter for general adult medical examination without abnormal findings: Secondary | ICD-10-CM

## 2016-01-03 NOTE — Telephone Encounter (Signed)
Ok per Dr. Sarajane Jews and make sure to add a PSA to lab order.

## 2016-01-03 NOTE — Telephone Encounter (Signed)
I put future lab orders in for [place] location and left a voice message for pt with this information.

## 2016-01-24 ENCOUNTER — Other Ambulatory Visit (INDEPENDENT_AMBULATORY_CARE_PROVIDER_SITE_OTHER): Payer: 59

## 2016-01-24 DIAGNOSIS — Z0000 Encounter for general adult medical examination without abnormal findings: Secondary | ICD-10-CM

## 2016-01-24 LAB — CBC WITH DIFFERENTIAL/PLATELET
Basophils Absolute: 0 10*3/uL (ref 0.0–0.1)
Basophils Relative: 0.7 % (ref 0.0–3.0)
Eosinophils Absolute: 0.2 10*3/uL (ref 0.0–0.7)
Eosinophils Relative: 3.9 % (ref 0.0–5.0)
HCT: 40.7 % (ref 39.0–52.0)
Hemoglobin: 13.5 g/dL (ref 13.0–17.0)
Lymphocytes Relative: 20.1 % (ref 12.0–46.0)
Lymphs Abs: 1.2 10*3/uL (ref 0.7–4.0)
MCHC: 33.2 g/dL (ref 30.0–36.0)
MCV: 89.2 fl (ref 78.0–100.0)
Monocytes Absolute: 0.3 10*3/uL (ref 0.1–1.0)
Monocytes Relative: 5.9 % (ref 3.0–12.0)
Neutro Abs: 4 10*3/uL (ref 1.4–7.7)
Neutrophils Relative %: 69.4 % (ref 43.0–77.0)
Platelets: 208 10*3/uL (ref 150.0–400.0)
RBC: 4.56 Mil/uL (ref 4.22–5.81)
RDW: 12.9 % (ref 11.5–15.5)
WBC: 5.8 10*3/uL (ref 4.0–10.5)

## 2016-01-24 LAB — URINALYSIS
Bilirubin Urine: NEGATIVE
Hgb urine dipstick: NEGATIVE
Ketones, ur: NEGATIVE
Leukocytes, UA: NEGATIVE
Nitrite: NEGATIVE
Specific Gravity, Urine: 1.015 (ref 1.000–1.030)
Total Protein, Urine: NEGATIVE
Urine Glucose: NEGATIVE
Urobilinogen, UA: 0.2 (ref 0.0–1.0)
pH: 6.5 (ref 5.0–8.0)

## 2016-01-28 ENCOUNTER — Telehealth: Payer: Self-pay | Admitting: Family Medicine

## 2016-01-29 ENCOUNTER — Encounter: Payer: Self-pay | Admitting: Family Medicine

## 2016-01-29 ENCOUNTER — Ambulatory Visit (INDEPENDENT_AMBULATORY_CARE_PROVIDER_SITE_OTHER): Payer: 59 | Admitting: Family Medicine

## 2016-01-29 VITALS — BP 106/69 | HR 80 | Temp 98.2°F | Ht 71.0 in | Wt 147.0 lb

## 2016-01-29 DIAGNOSIS — J449 Chronic obstructive pulmonary disease, unspecified: Secondary | ICD-10-CM | POA: Insufficient documentation

## 2016-01-29 DIAGNOSIS — Z0000 Encounter for general adult medical examination without abnormal findings: Secondary | ICD-10-CM

## 2016-01-29 MED ORDER — TADALAFIL 20 MG PO TABS: 20.0000 mg | ORAL_TABLET | Freq: Every day | ORAL | 11 refills | Status: DC | PRN

## 2016-01-29 NOTE — Progress Notes (Signed)
   Subjective:    Patient ID: Guy OLDMAN SR., male    DOB: Apr 30, 1955, 60 y.o.   MRN: [id number]  HPI 60 yr old male for a well exam. He feels fine. As of a few months ago he is using Advair daily and a Ventolin inhaler prn which were provided to him by an occupational doctor at his job. He was diagnosed with COPD (he quit smoking in 2014) although he has not had a CXR. He finds these very helpful for the mild SOB he used to feel.    Review of Systems  Constitutional: Negative.   HENT: Negative.   Eyes: Negative.   Respiratory: Negative.   Cardiovascular: Negative.   Gastrointestinal: Negative.   Genitourinary: Negative.   Musculoskeletal: Negative.   Skin: Negative.   Neurological: Negative.   Psychiatric/Behavioral: Negative.        Objective:   Physical Exam  Constitutional: He is oriented to person, place, and time. He appears well-developed and well-nourished. No distress.  HENT:  Head: Normocephalic and atraumatic.  Right Ear: External ear normal.  Left Ear: External ear normal.  Nose: Nose normal.  Mouth/Throat: Oropharynx is clear and moist. No oropharyngeal exudate.  Eyes: Conjunctivae and EOM are normal. Pupils are equal, round, and reactive to light. Right eye exhibits no discharge. Left eye exhibits no discharge. No scleral icterus.  Neck: Neck supple. No JVD present. No tracheal deviation present. No thyromegaly present.  Cardiovascular: Normal rate, regular rhythm, normal heart sounds and intact distal pulses.  Exam reveals no gallop and no friction rub.   No murmur heard. EKG normal   Pulmonary/Chest: Effort normal and breath sounds normal. No respiratory distress. He has no wheezes. He has no rales. He exhibits no tenderness.  Abdominal: Soft. Bowel sounds are normal. He exhibits no distension and no mass. There is no tenderness. There is no rebound and no guarding.  Genitourinary: Rectum normal, prostate normal and penis normal. Rectal exam shows  guaiac negative stool. No penile tenderness.  Musculoskeletal: Normal range of motion. He exhibits no edema or tenderness.  Lymphadenopathy:    He has no cervical adenopathy.  Neurological: He is alert and oriented to person, place, and time. He has normal reflexes. No cranial nerve deficit. He exhibits normal muscle tone. Coordination normal.  Skin: Skin is warm and dry. No rash noted. He is not diaphoretic. No erythema. No pallor.  Psychiatric: He has a normal mood and affect. His behavior is normal. Judgment and thought content normal.          Assessment & Plan:  Well exam. We discussed diet and exercise. Get fasting labs soon.  Laurey Morale, MD

## 2016-01-29 NOTE — Progress Notes (Signed)
Pre visit review using our clinic review tool, if applicable. No additional management support is needed unless otherwise documented below in the visit note. 

## 2016-01-29 NOTE — Addendum Note (Signed)
Addended by: Elmer Picker on: 01/29/2016 03:44 PM   Modules accepted: Orders

## 2016-01-31 ENCOUNTER — Other Ambulatory Visit (INDEPENDENT_AMBULATORY_CARE_PROVIDER_SITE_OTHER): Payer: 59

## 2016-01-31 DIAGNOSIS — Z0000 Encounter for general adult medical examination without abnormal findings: Secondary | ICD-10-CM | POA: Diagnosis not present

## 2016-01-31 LAB — HEPATIC FUNCTION PANEL
ALT: 10 U/L (ref 0–53)
AST: 14 U/L (ref 0–37)
Albumin: 3.9 g/dL (ref 3.5–5.2)
Alkaline Phosphatase: 54 U/L (ref 39–117)
Bilirubin, Direct: 0.2 mg/dL (ref 0.0–0.3)
Total Bilirubin: 0.9 mg/dL (ref 0.2–1.2)
Total Protein: 6.3 g/dL (ref 6.0–8.3)

## 2016-01-31 LAB — BASIC METABOLIC PANEL WITH GFR
BUN: 17 mg/dL (ref 6–23)
CO2: 31 meq/L (ref 19–32)
Calcium: 9.5 mg/dL (ref 8.4–10.5)
Chloride: 102 meq/L (ref 96–112)
Creatinine, Ser: 1.06 mg/dL (ref 0.40–1.50)
GFR: 90.99 mL/min
Glucose, Bld: 94 mg/dL (ref 70–99)
Potassium: 4.5 meq/L (ref 3.5–5.1)
Sodium: 138 meq/L (ref 135–145)

## 2016-01-31 LAB — PSA: PSA: 0.37 ng/mL (ref 0.10–4.00)

## 2016-01-31 LAB — LIPID PANEL
Cholesterol: 180 mg/dL (ref 0–200)
HDL: 48.2 mg/dL
LDL Cholesterol: 121 mg/dL — ABNORMAL HIGH (ref 0–99)
NonHDL: 131.7
Total CHOL/HDL Ratio: 4
Triglycerides: 52 mg/dL (ref 0.0–149.0)
VLDL: 10.4 mg/dL (ref 0.0–40.0)

## 2016-01-31 LAB — TSH: TSH: 0.87 u[IU]/mL (ref 0.35–4.50)

## 2016-02-11 ENCOUNTER — Telehealth: Payer: Self-pay

## 2016-02-11 NOTE — Telephone Encounter (Signed)
Received PA request from [facility] for Cialis 20mg  tablets. PA submitted & is pending. Key: Guy Carlson

## 2016-02-27 NOTE — Telephone Encounter (Signed)
Forms faxed to insurance company per insurance's request.

## 2016-03-03 ENCOUNTER — Telehealth: Payer: Self-pay | Admitting: Family Medicine

## 2016-03-03 NOTE — Telephone Encounter (Signed)
This is a duplicate, see previous note.  

## 2016-03-03 NOTE — Telephone Encounter (Signed)
Stop the Cialis and call in Sildenafil 20 mg to take 5 tabs as needed, #50 with 11 rf

## 2016-03-03 NOTE — Telephone Encounter (Signed)
Request from [facility], Cialis is not covered by insurance, consider Sildenafil 20 mg.

## 2016-03-03 NOTE — Telephone Encounter (Signed)
Received fax back from insurance stating that they need a clinical supporting cause of erectile dysfunction. I've already tried using hyperlipidemia and they are asking for more clinical support. Please advise.

## 2016-03-04 MED ORDER — SILDENAFIL CITRATE 20 MG PO TABS: ORAL_TABLET | ORAL | 11 refills | Status: DC

## 2016-03-04 NOTE — Telephone Encounter (Signed)
Rx sent 

## 2016-03-05 ENCOUNTER — Telehealth: Payer: Self-pay

## 2016-03-05 NOTE — Telephone Encounter (Signed)
Per pharmacy pt's insurance does not cover Cialis, but will cover Sildenafil.   Dr. Sarajane Jews - Please advise if ok to change prescription. Thanks!

## 2016-03-05 NOTE — Telephone Encounter (Signed)
We sent this in 2 days ago

## 2016-06-02 ENCOUNTER — Encounter: Payer: Self-pay | Admitting: Family Medicine

## 2016-06-02 ENCOUNTER — Ambulatory Visit (INDEPENDENT_AMBULATORY_CARE_PROVIDER_SITE_OTHER): Payer: 59 | Admitting: Family Medicine

## 2016-06-02 VITALS — BP 133/85 | HR 83 | Temp 98.2°F | Ht 71.0 in | Wt 149.0 lb

## 2016-06-02 DIAGNOSIS — J018 Other acute sinusitis: Secondary | ICD-10-CM

## 2016-06-02 MED ORDER — AZITHROMYCIN 250 MG PO TABS: ORAL_TABLET | ORAL | 0 refills | Status: DC

## 2016-06-02 NOTE — Progress Notes (Signed)
Pre visit review using our clinic review tool, if applicable. No additional management support is needed unless otherwise documented below in the visit note. 

## 2016-06-02 NOTE — Progress Notes (Signed)
   Subjective:    Patient ID: Guy MAULE SR., male    DOB: 26-Sep-1955, 61 y.o.   MRN: [id number]  HPI Here for 3 days of sinus pressure, PND, and coughing up green sputum. No fever. Using Theraflu.    Review of Systems  Constitutional: Negative.   HENT: Positive for congestion, postnasal drip, sinus pain and sinus pressure. Negative for sore throat.   Eyes: Negative.   Respiratory: Positive for cough.        Objective:   Physical Exam  Constitutional: He appears well-developed and well-nourished.  HENT:  Right Ear: External ear normal.  Left Ear: External ear normal.  Nose: Nose normal.  Mouth/Throat: Oropharynx is clear and moist.  Eyes: Conjunctivae are normal.  Neck: No thyromegaly present.  Pulmonary/Chest: Effort normal and breath sounds normal.  Lymphadenopathy:    He has no cervical adenopathy.          Assessment & Plan:  Sinusitis, treat with a Zpack. Written out of work 06-01-16 through 06-03-16. [name], MD

## 2017-01-14 ENCOUNTER — Encounter: Payer: Self-pay | Admitting: Family Medicine

## 2017-05-20 ENCOUNTER — Other Ambulatory Visit: Payer: Self-pay | Admitting: Family Medicine

## 2017-05-21 NOTE — Telephone Encounter (Signed)
Last OV 06/02/2016  Rx last refilled 03/04/2016 disp 50 with 11 refills   Sent to PCP for approval

## 2017-05-26 ENCOUNTER — Other Ambulatory Visit: Payer: Self-pay | Admitting: Family Medicine

## 2017-07-26 ENCOUNTER — Encounter: Payer: Self-pay | Admitting: Gastroenterology

## 2017-09-15 ENCOUNTER — Encounter: Payer: Self-pay | Admitting: Family Medicine

## 2017-09-15 ENCOUNTER — Ambulatory Visit: Payer: 59 | Admitting: Family Medicine

## 2017-09-15 VITALS — BP 98/62 | HR 83 | Temp 98.2°F | Ht 71.0 in | Wt 151.0 lb

## 2017-09-15 DIAGNOSIS — J439 Emphysema, unspecified: Secondary | ICD-10-CM

## 2017-09-15 DIAGNOSIS — J209 Acute bronchitis, unspecified: Secondary | ICD-10-CM

## 2017-09-15 DIAGNOSIS — L309 Dermatitis, unspecified: Secondary | ICD-10-CM

## 2017-09-15 MED ORDER — AMOXICILLIN-POT CLAVULANATE 875-125 MG PO TABS
1.0000 | ORAL_TABLET | Freq: Two times a day (BID) | ORAL | 0 refills | Status: DC
Start: 2017-09-15 — End: 2017-11-24

## 2017-09-15 MED ORDER — BUDESONIDE-FORMOTEROL FUMARATE 160-4.5 MCG/ACT IN AERO: 2.0000 | INHALATION_SPRAY | Freq: Two times a day (BID) | RESPIRATORY_TRACT | 5 refills | Status: DC

## 2017-09-15 MED ORDER — BETAMETHASONE DIPROPIONATE 0.05 % EX CREA: TOPICAL_CREAM | Freq: Two times a day (BID) | CUTANEOUS | 2 refills | Status: DC

## 2017-09-15 NOTE — Progress Notes (Signed)
   Subjective:    Patient ID: Guy ROBART SR., male    DOB: 1955-11-27, 62 y.o.   MRN: [id number]  HPI Here for 3 days of chest congestion and a dry cough. He notes that over the past 6 months he has had more trouble with chest tightness and SOB, especially when he is working out in the eat. He has been on Advair for years now. He rarely uses his Ventolin inhaler. Also the itchy rash on his lower legs is worse and Triamcinolone no longer helps much.    Review of Systems  Constitutional: Negative.   HENT: Negative.   Eyes: Negative.   Respiratory: Positive for cough, chest tightness, shortness of breath and wheezing.   Cardiovascular: Negative.   Skin: Positive for rash.       Objective:   Physical Exam  Constitutional: He appears well-developed and well-nourished.  HENT:  Right Ear: External ear normal.  Left Ear: External ear normal.  Nose: Nose normal.  Mouth/Throat: Oropharynx is clear and moist.  Eyes: Conjunctivae are normal.  Neck: No thyromegaly present.  Cardiovascular: Normal rate, regular rhythm, normal heart sounds and intact distal pulses.  Pulmonary/Chest: Effort normal. No respiratory distress. He has no rales.  Scattered rhonchi and wheezes   Lymphadenopathy:    He has no cervical adenopathy.  Skin:  Patches of dry, red, scaly skin on both anterior lower legs           Assessment & Plan:  He has an acute bronchitis, treat with Augmentin. For the COPD, stop Advair and try Symbicort bid. For the eczema try Betamethasone cream.  [name], MD

## 2017-09-21 ENCOUNTER — Ambulatory Visit: Payer: Self-pay | Admitting: Family Medicine

## 2017-09-21 ENCOUNTER — Encounter: Payer: Self-pay | Admitting: Family Medicine

## 2017-09-21 ENCOUNTER — Ambulatory Visit: Payer: 59 | Admitting: Family Medicine

## 2017-09-21 VITALS — BP 106/66 | HR 74 | Temp 97.8°F | Ht 71.0 in | Wt 152.2 lb

## 2017-09-21 DIAGNOSIS — J209 Acute bronchitis, unspecified: Secondary | ICD-10-CM

## 2017-09-21 MED ORDER — HYDROCODONE-HOMATROPINE 5-1.5 MG/5ML PO SYRP: 5.0000 mL | ORAL_SOLUTION | ORAL | 0 refills | Status: DC | PRN

## 2017-09-21 NOTE — Progress Notes (Signed)
   Subjective:    Patient ID: Guy CORMAN SR., male    DOB: [birth year], 62 y.o.   MRN: [id number]  HPI Here to follow up on bronchitis. He is on day 5 of Augmentin and he feels better. He asks for something for the cough so he can sleep. He is producing clear sputum. No fever. He wants to return to work tomorrow.    Review of Systems  Constitutional: Negative.   HENT: Negative.   Eyes: Negative.   Respiratory: Positive for cough. Negative for shortness of breath and wheezing.   Cardiovascular: Negative.        Objective:   Physical Exam  Constitutional: He appears well-developed and well-nourished.  HENT:  Right Ear: External ear normal.  Left Ear: External ear normal.  Mouth/Throat: Oropharynx is clear and moist.  Eyes: Conjunctivae are normal.  Neck: No thyromegaly present.  Pulmonary/Chest: Effort normal and breath sounds normal. No stridor. No respiratory distress. He has no wheezes. He has no rales.  Lymphadenopathy:    He has no cervical adenopathy.          Assessment & Plan:  Bronchitis, resolving. Given Hydromet for the cough. Finish out the Augmentin. Written out of work from 09-15-17 until 09-22-17.  [name], MD

## 2017-09-21 NOTE — Telephone Encounter (Signed)
Reports "still coughing pretty bad and still on an antibiotic." Also has a runny nose. Couldn't go to work Friday or today. No wheezing noted. Appointment made for today.   Reason for Disposition . [1] Continuous (nonstop) coughing interferes with work or school AND [2] no improvement using cough treatment per Care Advice  Answer Assessment - Initial Assessment Questions 1. ONSET: "When did the cough begin?"      2 weeks ago 2. SEVERITY: "How bad is the cough today?"      Moderate 3. RESPIRATORY DISTRESS: "Describe your breathing."      COPD 4. FEVER: "Do you have a fever?" If so, ask: "What is your temperature, how was it measured, and when did it start?"     No 5. SPUTUM: "Describe the color of your sputum" (clear, white, yellow, green)     Unsure 6. HEMOPTYSIS: "Are you coughing up any blood?" If so ask: "How much?" (flecks, streaks, tablespoons, etc.)     No 7. CARDIAC HISTORY: "Do you have any history of heart disease?" (e.g., heart attack, congestive heart failure)      No 8. LUNG HISTORY: "Do you have any history of lung disease?"  (e.g., pulmonary embolus, asthma, emphysema)     COPD 9. PE RISK FACTORS: "Do you have a history of blood clots?" (or: recent major surgery, recent prolonged travel, bedridden )     nO 10. OTHER SYMPTOMS: "Do you have any other symptoms?" (e.g., runny nose, wheezing, chest pain)       Runny nose 11. PREGNANCY: "Is there any chance you are pregnant?" "When was your last menstrual period?"       No 12. TRAVEL: "Have you traveled out of the country in the last month?" (e.g., travel history, exposures)       No  Protocols used: [place]

## 2017-11-24 ENCOUNTER — Ambulatory Visit: Payer: Self-pay

## 2017-11-24 ENCOUNTER — Encounter: Payer: Self-pay | Admitting: Family Medicine

## 2017-11-24 ENCOUNTER — Ambulatory Visit (INDEPENDENT_AMBULATORY_CARE_PROVIDER_SITE_OTHER): Payer: 59 | Admitting: Family Medicine

## 2017-11-24 VITALS — BP 118/72 | HR 92 | Temp 98.1°F | Ht 71.0 in | Wt 151.4 lb

## 2017-11-24 DIAGNOSIS — J439 Emphysema, unspecified: Secondary | ICD-10-CM

## 2017-11-24 DIAGNOSIS — J440 Chronic obstructive pulmonary disease with acute lower respiratory infection: Secondary | ICD-10-CM | POA: Diagnosis not present

## 2017-11-24 DIAGNOSIS — J209 Acute bronchitis, unspecified: Secondary | ICD-10-CM | POA: Diagnosis not present

## 2017-11-24 MED ORDER — AMOXICILLIN-POT CLAVULANATE 875-125 MG PO TABS: 1.0000 | ORAL_TABLET | Freq: Two times a day (BID) | ORAL | 0 refills | Status: DC

## 2017-11-24 NOTE — Progress Notes (Signed)
   Subjective:    Patient ID: MILFORD CILENTO SR., male    DOB: 15-Aug-1955, 62 y.o.   MRN: [id number]  HPI Here for 4 days of chest tightness and coughing up yellow sputum. No fever. Using his Symbicort and Ventolin as usual.    Review of Systems  Constitutional: Negative.   HENT: Negative.   Eyes: Negative.   Respiratory: Positive for cough, chest tightness, shortness of breath and wheezing.   Cardiovascular: Negative.        Objective:   Physical Exam  Constitutional: He appears well-developed and well-nourished. No distress.  HENT:  Right Ear: External ear normal.  Left Ear: External ear normal.  Nose: Nose normal.  Mouth/Throat: Oropharynx is clear and moist.  Eyes: Conjunctivae are normal.  Neck: No thyromegaly present.  Cardiovascular: Normal rate, regular rhythm, normal heart sounds and intact distal pulses.  Pulmonary/Chest: Effort normal. He has no rales.  Soft scattered rhonchi and wheezes  Lymphadenopathy:    He has no cervical adenopathy.          Assessment & Plan:  Acute bronchitis on top of COPD. Treat with Augmentin. Refer to Pulmonary for further evaluation.  [name], MD

## 2017-11-24 NOTE — Telephone Encounter (Signed)
Pt is on the schedule to see Dr. Sarajane Jews today 11/24/2017.

## 2017-11-24 NOTE — Telephone Encounter (Signed)
Pt. Reports he started coughing this past Sunday.Productive with clear mucus. Has some wheezing and shortness of breath with exertion. States he "wants to be sure this isn't a COPD flare up." Denies chest pain. Appointment made.  Reason for Disposition . [1] Known COPD or other severe lung disease (i.e., bronchiectasis, cystic fibrosis, lung surgery) AND [2] worsening symptoms (i.e., increased sputum purulence or amount, increased breathing difficulty  Answer Assessment - Initial Assessment Questions 1. ONSET: "When did the cough begin?"      Started Sunday 2. SEVERITY: "How bad is the cough today?"      "PRETTY BAD" 3. RESPIRATORY DISTRESS: "Describe your breathing."      Some shortness of breath with exertion 4. FEVER: "Do you have a fever?" If so, ask: "What is your temperature, how was it measured, and when did it start?"     Np 5. SPUTUM: "Describe the color of your sputum" (clear, white, yellow, green)     Clear 6. HEMOPTYSIS: "Are you coughing up any blood?" If so ask: "How much?" (flecks, streaks, tablespoons, etc.)     No 7. CARDIAC HISTORY: "Do you have any history of heart disease?" (e.g., heart attack, congestive heart failure)      No 8. LUNG HISTORY: "Do you have any history of lung disease?"  (e.g., pulmonary embolus, asthma, emphysema)     COPD 9. PE RISK FACTORS: "Do you have a history of blood clots?" (or: recent major surgery, recent prolonged travel, bedridden)     No 10. OTHER SYMPTOMS: "Do you have any other symptoms?" (e.g., runny nose, wheezing, chest pain)       Wheezing - no chest pain 11. PREGNANCY: "Is there any chance you are pregnant?" "When was your last menstrual period?"       n/a 12. TRAVEL: "Have you traveled out of the country in the last month?" (e.g., travel history, exposures)       No  Protocols used: [place]

## 2017-12-17 ENCOUNTER — Encounter: Payer: Self-pay | Admitting: Emergency Medicine

## 2017-12-17 ENCOUNTER — Ambulatory Visit (INDEPENDENT_AMBULATORY_CARE_PROVIDER_SITE_OTHER)
Admission: RE | Admit: 2017-12-17 | Discharge: 2017-12-17 | Disposition: A | Discharge status: Home / Self Care | Payer: 59 | Source: Ambulatory Visit | Attending: Emergency Medicine | Admitting: Emergency Medicine

## 2017-12-17 ENCOUNTER — Ambulatory Visit (INDEPENDENT_AMBULATORY_CARE_PROVIDER_SITE_OTHER): Payer: 59 | Admitting: Emergency Medicine

## 2017-12-17 VITALS — BP 126/64 | HR 80 | Ht 71.0 in | Wt 152.0 lb

## 2017-12-17 DIAGNOSIS — R0609 Other forms of dyspnea: Secondary | ICD-10-CM

## 2017-12-17 DIAGNOSIS — J439 Emphysema, unspecified: Secondary | ICD-10-CM

## 2017-12-17 DIAGNOSIS — R0600 Dyspnea, unspecified: Secondary | ICD-10-CM

## 2017-12-17 MED ORDER — TIOTROPIUM BROMIDE-OLODATEROL 2.5-2.5 MCG/ACT IN AERS: 2.0000 | INHALATION_SPRAY | Freq: Every day | RESPIRATORY_TRACT | 0 refills | Status: DC

## 2017-12-17 NOTE — Patient Instructions (Addendum)
Please stop Symbicort and Advair if you have any of these medications at home. We will start Stiolto 2 puffs once daily.  This will be your maintenance inhaler medication that you will take every day on a schedule. Keep albuterol (Ventolin) available to use 2 puffs up to every 4 hours if needed for shortness of breath, coughing, chest tightness. We will perform walking oximetry today on room air We will perform full pulmonary function testing CXR today Follow with Dr Lamonte Sakai in 1 month or next available with full PFT

## 2017-12-17 NOTE — Progress Notes (Signed)
Patient seen in the office today and instructed on use of Stiolto.  Patient expressed understanding and demonstrated technique. 

## 2017-12-17 NOTE — Assessment & Plan Note (Signed)
Agreed that based on his tobacco history and his symptoms that he likely does have COPD.  Over the last year it appears to have started to impact his functional capacity.  He does not exacerbate frequently although he was treated for bronchitis possibly 1 month ago.  I believe he needs pulmonary function testing to quantify his degree of obstruction.  If he surprises me and he does not have enough COPD to explain his functional limitations then we may need to expand his work-up, refer him to cardiology, etc.  In the meantime I will try him on Stiolto to see if he gets more benefit than from Symbicort.  Instructed him on the difference between maintenance and rescue bronchodilators.  He will keep albuterol available to use as needed.  He needs a walking oximetry to rule out occult hypoxemia and we will do this today.  Also will perform a chest x-ray.  Consider alpha-1 antitrypsin deficiency screening in the future depending on the PFT.

## 2017-12-17 NOTE — Progress Notes (Signed)
Subjective:    Patient ID: Guy LONGMAN SR., male    DOB: 1956-02-11, 62 y.o.   MRN: [id number]  HPI 62 year old former smoker (45 pack years) with a history of hyperlipidemia, COPD that was diagnosed approximately 4 to 5 years ago.  He is referred here today for further evaluation of his COPD.  Hx taken from him and his wife. He was seen July 31 with cough, purulent sputum, dyspnea, and symptoms consistent with an acute bronchitis.  He received Augmentin, changed to Symbicort  Has been on advair in the past, was changed to symbicort as above.    He still works and has exertional SOB, at work or climbing stairs. He has been dealing with some chronic hoarseness. He has chronic fatigue and inability to function as well over the last year.    Review of Systems  Constitutional: Negative for fever and unexpected weight change.  HENT: Positive for congestion. Negative for dental problem, ear pain, nosebleeds, postnasal drip, rhinorrhea, sinus pressure, sneezing, sore throat and trouble swallowing.   Eyes: Negative for redness and itching.  Respiratory: Positive for cough and shortness of breath. Negative for chest tightness and wheezing.   Cardiovascular: Negative for palpitations and leg swelling.  Gastrointestinal: Negative for nausea and vomiting.  Genitourinary: Negative for dysuria.  Musculoskeletal: Negative for joint swelling.  Skin: Negative for rash.  Neurological: Negative for headaches.  Hematological: Does not bruise/bleed easily.  Psychiatric/Behavioral: Negative for dysphoric mood. The patient is not nervous/anxious.    Past Medical History:  Diagnosis Date  . COPD (chronic obstructive pulmonary disease) ([place])   . Hyperlipidemia      Family History  Problem Relation Age of Onset  . Diabetes Father   . Kidney disease Father   . Diabetes Mother   . Mental illness Sister        committed suicide  . HIV Brother        died of AIDS  . Breast cancer Maternal Aunt         x2  . Ovarian cancer Sister   . Heart disease Maternal Aunt   . Heart disease Paternal Aunt      Social History   Socioeconomic History  . Marital status: Married    Spouse name: Not on file  . Number of children: 3  . Years of education: Not on file  . Highest education level: Not on file  Occupational History    Employer: [employer]  Social Needs  . Financial resource strain: Not on file  . Food insecurity:    Worry: Not on file    Inability: Not on file  . Transportation needs:    Medical: Not on file    Non-medical: Not on file  Tobacco Use  . Smoking status: Former Smoker    Packs/day: 1.50    Years: 30.00    Pack years: 45.00    Types: Cigarettes    Last attempt to quit: 06/16/2012    Years since quitting: 5.5  . Smokeless tobacco: Never Used  Substance and Sexual Activity  . Alcohol use: No    Alcohol/week: 0.0 standard drinks  . Drug use: No  . Sexual activity: Not on file  Lifestyle  . Physical activity:    Days per week: Not on file    Minutes per session: Not on file  . Stress: Not on file  Relationships  . Social connections:    Talks on phone: Not on file    Gets together:  Not on file    Attends religious service: Not on file    Active member of club or organization: Not on file    Attends meetings of clubs or organizations: Not on file    Relationship status: Not on file  . Intimate partner violence:    Fear of current or ex partner: Not on file    Emotionally abused: Not on file    Physically abused: Not on file    Forced sexual activity: Not on file  Other Topics Concern  . Not on file  Social History Narrative  . 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   No Known Allergies   Outpatient Medications Prior to Visit  Medication Sig Dispense Refill  . Albuterol Sulfate (VENTOLIN HFA IN) Inhale into the lungs. Take 1-2 puffs as needed    . betamethasone  dipropionate (DIPROLENE) 0.05 % cream Apply topically 2 (two) times daily. 45 g 2  . budesonide-formoterol (SYMBICORT) 160-4.5 MCG/ACT inhaler Inhale 2 puffs into the lungs 2 (two) times daily. 1 Inhaler 5  . Ferrous Sulfate (IRON CR PO) Take 1 each by mouth daily.      . ranitidine (ZANTAC) 150 MG capsule Take 150 mg by mouth as needed.     . sildenafil (REVATIO) 20 MG tablet TAKE 5 TABLETS BY MOUTH AS NEEDED 50 tablet 2  . amoxicillin-clavulanate (AUGMENTIN) 875-125 MG tablet Take 1 tablet by mouth 2 (two) times daily. 20 tablet 0   No facility-administered medications prior to visit.         Objective:   Physical Exam  Vitals:   12/17/17 1352  BP: 126/64  Pulse: 80  SpO2: 100%  Weight: 152 lb (68.9 kg)  Height: 5\' 11"  (1.803 m)   Gen: Pleasant, thin, in no distress,  normal affect  ENT: No lesions,  mouth clear,  oropharynx clear, no postnasal drip  Neck: No JVD, no TMG, no carotid bruits  Lungs: No use of accessory muscles, no dullness to percussion, clear without rales or rhonchi  Cardiovascular: RRR, heart sounds normal, no murmur or gallops, no peripheral edema  Musculoskeletal: No deformities, no cyanosis or clubbing  Neuro: alert, non focal  Skin: Warm, no lesions or rash    Assessment & Plan:  COPD (chronic obstructive pulmonary disease) ([facility]) Agreed that based on his tobacco history and his symptoms that he likely does have COPD.  Over the last year it appears to have started to impact his functional capacity.  He does not exacerbate frequently although he was treated for bronchitis possibly 1 month ago.  I believe he needs pulmonary function testing to quantify his degree of obstruction.  If he surprises me and he does not have enough COPD to explain his functional limitations then we may need to expand his work-up, refer him to cardiology, etc.  In the meantime I will try him on Stiolto to see if he gets more benefit than from Symbicort.  Instructed him on the  difference between maintenance and rescue bronchodilators.  He will keep albuterol available to use as needed.  He needs a walking oximetry to rule out occult hypoxemia and we will do this today.  Also will perform a chest x-ray.  Consider alpha-1 antitrypsin deficiency screening in the future depending on the PFT.  Baltazar Apo, MD, PhD 12/17/2017, 2:23 PM [facility] [phone] or if no answer [phone]

## 2017-12-30 ENCOUNTER — Encounter: Payer: Self-pay | Admitting: Gastroenterology

## 2018-01-13 ENCOUNTER — Ambulatory Visit (AMBULATORY_SURGERY_CENTER): Payer: Self-pay

## 2018-01-13 VITALS — Ht 71.5 in | Wt 151.2 lb

## 2018-01-13 DIAGNOSIS — Z86010 Personal history of colonic polyps: Secondary | ICD-10-CM

## 2018-01-13 MED ORDER — NA SULFATE-K SULFATE-MG SULF 17.5-3.13-1.6 GM/177ML PO SOLN: 1.0000 | Freq: Once | ORAL | 0 refills | Status: AC

## 2018-01-13 NOTE — Progress Notes (Signed)
Denies allergies to eggs or soy products. Denies complication of anesthesia or sedation. Denies use of weight loss medication. Denies use of O2.   Emmi instructions declined.   A pay no more than 50.00 coupon was given to patient.

## 2018-01-19 ENCOUNTER — Ambulatory Visit: Payer: 59 | Admitting: Emergency Medicine

## 2018-01-19 ENCOUNTER — Ambulatory Visit (INDEPENDENT_AMBULATORY_CARE_PROVIDER_SITE_OTHER): Payer: 59 | Admitting: Emergency Medicine

## 2018-01-19 ENCOUNTER — Other Ambulatory Visit: Payer: Self-pay | Admitting: Emergency Medicine

## 2018-01-19 ENCOUNTER — Encounter: Payer: Self-pay | Admitting: Emergency Medicine

## 2018-01-19 DIAGNOSIS — Z87891 Personal history of nicotine dependence: Secondary | ICD-10-CM

## 2018-01-19 DIAGNOSIS — J439 Emphysema, unspecified: Secondary | ICD-10-CM

## 2018-01-19 DIAGNOSIS — R0609 Other forms of dyspnea: Secondary | ICD-10-CM

## 2018-01-19 DIAGNOSIS — R0600 Dyspnea, unspecified: Secondary | ICD-10-CM

## 2018-01-19 LAB — PULMONARY FUNCTION TEST
DL/VA % pred: 67 %
DL/VA: 3.12 ml/min/mmHg/L
DLCO unc % pred: 48 %
DLCO unc: 15.61 ml/min/mmHg
FEF 25-75 Post: 0.46 L/sec
FEF 25-75 Pre: 0.54 L/sec
FEF2575-%Change-Post: -13 %
FEF2575-%Pred-Post: 16 %
FEF2575-%Pred-Pre: 19 %
FEV1-%Change-Post: -7 %
FEV1-%Pred-Post: 39 %
FEV1-%Pred-Pre: 42 %
FEV1-Post: 1.18 L
FEV1-Pre: 1.27 L
FEV1FVC-%Change-Post: -3 %
FEV1FVC-%Pred-Pre: 54 %
FEV6-%Change-Post: -4 %
FEV6-%Pred-Post: 71 %
FEV6-%Pred-Pre: 75 %
FEV6-Post: 2.71 L
FEV6-Pre: 2.84 L
FEV6FVC-%Change-Post: -1 %
FEV6FVC-%Pred-Post: 97 %
FEV6FVC-%Pred-Pre: 98 %
FVC-%Change-Post: -3 %
FVC-%Pred-Post: 73 %
FVC-%Pred-Pre: 75 %
FVC-Post: 2.89 L
FVC-Pre: 2.99 L
Post FEV1/FVC ratio: 41 %
Post FEV6/FVC ratio: 94 %
Pre FEV1/FVC ratio: 43 %
Pre FEV6/FVC Ratio: 95 %
RV % pred: 163 %
RV: 3.8 L
TLC % pred: 96 %
TLC: 6.74 L

## 2018-01-19 MED ORDER — TIOTROPIUM BROMIDE-OLODATEROL 2.5-2.5 MCG/ACT IN AERS: 2.0000 | INHALATION_SPRAY | Freq: Every day | RESPIRATORY_TRACT | 5 refills | Status: DC

## 2018-01-19 MED ORDER — ALBUTEROL SULFATE HFA 108 (90 BASE) MCG/ACT IN AERS: 2.0000 | INHALATION_SPRAY | RESPIRATORY_TRACT | 5 refills | Status: DC | PRN

## 2018-01-19 NOTE — Assessment & Plan Note (Signed)
Improved on Stiolto.  We will stop the Symbicort and continue it.  We will write him for an albuterol inhaler to use as needed.  He will get the flu shot at work and will inquire as to whether he can get the pneumonia vaccine as well.  We will plan to continue Stiolto 2 puffs once daily. We will order an albuterol inhaler for you to use 2 puffs up to every 4 hours if needed for shortness of breath, chest tightness, wheezing. Please get your flu shot at work as planned. Ask them at work whether they provide the pneumonia vaccine.  If so go ahead and get it.  If not we can give it to you here at your next visit. Follow with Dr Lamonte Sakai in 6 months or sooner if you have any problems

## 2018-01-19 NOTE — Patient Instructions (Signed)
We will plan to continue Stiolto 2 puffs once daily. We will order an albuterol inhaler for you to use 2 puffs up to every 4 hours if needed for shortness of breath, chest tightness, wheezing. Please get your flu shot at work as planned. Ask them at work whether they provide the pneumonia vaccine.  If so go ahead and get it.  If not we can give it to you here at your next visit. We talked today about the lung cancer screening program.  If you like to learn more about it or possibly participate then please let us know.  We can revisit later. Follow with Dr Lamonte Sakai in 6 months or sooner if you have any problems

## 2018-01-19 NOTE — Progress Notes (Signed)
Subjective:    Patient ID: Guy FIELDHOUSE SR., male    DOB: 28-Jan-1956, 62 y.o.   MRN: [id number]  Shortness of Breath  Pertinent negatives include no ear pain, fever, headaches, leg swelling, rash, rhinorrhea, sore throat, vomiting or wheezing.   62 year old former smoker (45 pack years) with a history of hyperlipidemia, COPD that was diagnosed approximately 4 to 5 years ago.  He is referred here today for further evaluation of his COPD.  Hx taken from him and his wife. He was seen July 31 with cough, purulent sputum, dyspnea, and symptoms consistent with an acute bronchitis.  He received Augmentin, changed to Symbicort  Has been on advair in the past, was changed to symbicort as above.    He still works and has exertional SOB, at work or climbing stairs. He has been dealing with some chronic hoarseness. He has chronic fatigue and inability to function as well over the last year.   ROV 01/19/18 --Mr. Guy Carlson is 8 and is a former smoker (quit 2014), has a history of COPD.  I met him after he was treated for an acute exacerbation in July.  At our initial visit we did a trial changing Symbicort to Stiolto to see if you get more benefit.  He reports that his breathing is better, cough frequency is better.  He underwent pulmonary function testing today which I have reviewed.  This shows very severe obstruction with an FEV1 of 1.27 L (43% predicted), no bronchodilator response, hyperinflated lung volumes and a decreased diffusion capacity that does not fully correct when adjusted for his alveolar volume.  He did not desaturate with ambulation at our initial visit. He does not have an albuterol right now.    Review of Systems  Constitutional: Negative for fever and unexpected weight change.  HENT: Positive for congestion. Negative for dental problem, ear pain, nosebleeds, postnasal drip, rhinorrhea, sinus pressure, sneezing, sore throat and trouble swallowing.   Eyes: Negative for redness and  itching.  Respiratory: Positive for cough and shortness of breath. Negative for chest tightness and wheezing.   Cardiovascular: Negative for palpitations and leg swelling.  Gastrointestinal: Negative for nausea and vomiting.  Genitourinary: Negative for dysuria.  Musculoskeletal: Negative for joint swelling.  Skin: Negative for rash.  Neurological: Negative for headaches.  Hematological: Does not bruise/bleed easily.  Psychiatric/Behavioral: Negative for dysphoric mood. The patient is not nervous/anxious.    Past Medical History:  Diagnosis Date  . COPD (chronic obstructive pulmonary disease) ([place])   . GERD (gastroesophageal reflux disease)   . Hyperlipidemia      Family History  Problem Relation Age of Onset  . Diabetes Father   . Kidney disease Father   . Diabetes Mother   . Mental illness Sister        committed suicide  . HIV Brother        died of AIDS  . Breast cancer Maternal Aunt        x2  . Ovarian cancer Sister   . Heart disease Maternal Aunt   . Heart disease Paternal Aunt   . Colon cancer Neg Hx   . Esophageal cancer Neg Hx   . Rectal cancer Neg Hx   . Stomach cancer Neg Hx      Social History   Socioeconomic History  . Marital status: Married    Spouse name: Not on file  . Number of children: 3  . Years of education: Not on file  . Highest education level:  Not on file  Occupational History    Employer: [employer]  Social Needs  . Financial resource strain: Not on file  . Food insecurity:    Worry: Not on file    Inability: Not on file  . Transportation needs:    Medical: Not on file    Non-medical: Not on file  Tobacco Use  . Smoking status: Former Smoker    Packs/day: 1.50    Years: 30.00    Pack years: 45.00    Types: Cigarettes    Last attempt to quit: 06/16/2012    Years since quitting: 5.5  . Smokeless tobacco: Never Used  Substance and Sexual Activity  . Alcohol use: No    Alcohol/week: 0.0 standard drinks  . Drug use: No    . Sexual activity: Not on file  Lifestyle  . Physical activity:    Days per week: Not on file    Minutes per session: Not on file  . Stress: Not on file  Relationships  . Social connections:    Talks on phone: Not on file    Gets together: Not on file    Attends religious service: Not on file    Active member of club or organization: Not on file    Attends meetings of clubs or organizations: Not on file    Relationship status: Not on file  . Intimate partner violence:    Fear of current or ex partner: Not on file    Emotionally abused: Not on file    Physically abused: Not on file    Forced sexual activity: Not on file  Other Topics Concern  . Not on file  Social History Narrative  . 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   No Known Allergies   Outpatient Medications Prior to Visit  Medication Sig Dispense Refill  . Ferrous Sulfate (IRON CR PO) Take 1 each by mouth daily.      . ranitidine (ZANTAC) 150 MG capsule Take 150 mg by mouth as needed.     . sildenafil (REVATIO) 20 MG tablet TAKE 5 TABLETS BY MOUTH AS NEEDED 50 tablet 2  . Tiotropium Bromide-Olodaterol (STIOLTO RESPIMAT) 2.5-2.5 MCG/ACT AERS Inhale 2 puffs into the lungs daily. 2 Inhaler 0   No facility-administered medications prior to visit.         Objective:   Physical Exam  Vitals:   01/19/18 1401  SpO2: 94%  Weight: 149 lb (67.6 kg)  Height: '5\' 10"'  (1.778 m)   Gen: Pleasant, thin, in no distress,  normal affect  ENT: No lesions,  mouth clear,  oropharynx clear, no postnasal drip  Neck: No JVD, no stridor  Lungs: No use of accessory muscles, no wheeze or crackles  Cardiovascular: RRR, heart sounds normal, no murmur or gallops, no peripheral edema  Musculoskeletal: No deformities, no cyanosis or clubbing  Neuro: alert, non focal  Skin: Warm, no lesions or rash    Assessment & Plan:  COPD (chronic obstructive pulmonary  disease) ([facility]) Improved on Stiolto.  We will stop the Symbicort and continue it.  We will write him for an albuterol inhaler to use as needed.  He will get the flu shot at work and will inquire as to whether he can get the pneumonia vaccine as well.  We will plan to continue Stiolto 2 puffs once daily. We will order an albuterol inhaler for you to use 2 puffs  up to every 4 hours if needed for shortness of breath, chest tightness, wheezing. Please get your flu shot at work as planned. Ask them at work whether they provide the pneumonia vaccine.  If so go ahead and get it.  If not we can give it to you here at your next visit. Follow with Dr Lamonte Sakai in 6 months or sooner if you have any problems  History of tobacco use We talked today about the lung cancer screening program.  If you like to learn more about it or possibly participate then please let us know.  We can revisit later.  Baltazar Apo, MD, PhD 01/19/2018, 2:18 PM [facility] [phone] or if no answer [phone]

## 2018-01-19 NOTE — Progress Notes (Signed)
PFT done today. 

## 2018-01-19 NOTE — Assessment & Plan Note (Signed)
We talked today about the lung cancer screening program.  If you like to learn more about it or possibly participate then please let us know.  We can revisit later.

## 2018-01-22 ENCOUNTER — Encounter: Payer: Self-pay | Admitting: Family Medicine

## 2018-01-22 ENCOUNTER — Ambulatory Visit (INDEPENDENT_AMBULATORY_CARE_PROVIDER_SITE_OTHER): Payer: 59 | Admitting: Family Medicine

## 2018-01-22 VITALS — BP 128/78 | HR 84 | Temp 97.6°F | Ht 70.0 in | Wt 152.0 lb

## 2018-01-22 DIAGNOSIS — T7840XA Allergy, unspecified, initial encounter: Secondary | ICD-10-CM | POA: Diagnosis not present

## 2018-01-22 MED ORDER — METHYLPREDNISOLONE ACETATE 80 MG/ML IJ SUSP
80.0000 mg | Freq: Once | INTRAMUSCULAR | Status: AC
  Administered 2018-01-22: 80 mg via INTRAMUSCULAR

## 2018-01-22 NOTE — Progress Notes (Signed)
Subjective:  Patient ID: Guy ELIZONDO SR., male    DOB: [birth year]  Age: 62 y.o. MRN: [id number]  CC: No chief complaint on file.   HPI Guy WITKOP SR. presents evaluation of a 2 day ho pruritic rash on his trunk, axilla, and arms. It has not affected his Palms, interdigital spaces, or groin. No herald patch. Wife is present and she is not affected. Not active sexually outside of the marriage. Started shortly after visit to pulmonary doctor. No meds were given then. No new meds. No new skin contacts.   Outpatient Medications Prior to Visit  Medication Sig Dispense Refill  . albuterol (PROVENTIL HFA;VENTOLIN HFA) 108 (90 Base) MCG/ACT inhaler Inhale 2 puffs into the lungs every 4 (four) hours as needed for wheezing or shortness of breath. 1 Inhaler 5  . Ferrous Sulfate (IRON CR PO) Take 1 each by mouth daily.      . ranitidine (ZANTAC) 150 MG capsule Take 150 mg by mouth as needed.     . sildenafil (REVATIO) 20 MG tablet TAKE 5 TABLETS BY MOUTH AS NEEDED 50 tablet 2  . Tiotropium Bromide-Olodaterol (STIOLTO RESPIMAT) 2.5-2.5 MCG/ACT AERS Inhale 2 puffs into the lungs daily. 1 Inhaler 5   No facility-administered medications prior to visit.     ROS Review of Systems  Constitutional: Negative.  Negative for chills, fatigue, fever and unexpected weight change.  HENT: Negative.   Eyes: Negative.   Respiratory: Negative for chest tightness, shortness of breath and wheezing.   Cardiovascular: Negative.   Musculoskeletal: Negative for arthralgias and myalgias.  Skin: Positive for color change and rash.  Hematological: Negative.   Psychiatric/Behavioral: Negative.     Objective:  BP 128/78 (BP Location: Left Arm, Patient Position: Sitting, Cuff Size: Normal)   Pulse 84   Temp 97.6 F (36.4 C) (Oral)   Ht 5\' 10"  (1.778 m)   Wt 152 lb (68.9 kg)   SpO2 96%   BMI 21.81 kg/m   BP Readings from Last 3 Encounters:  01/22/18 128/78  12/17/17 126/64  11/24/17 118/72     Wt Readings from Last 3 Encounters:  01/22/18 152 lb (68.9 kg)  01/19/18 149 lb (67.6 kg)  01/13/18 151 lb 3.2 oz (68.6 kg)    Physical Exam  Constitutional: He is oriented to person, place, and time. He appears well-nourished. No distress.  HENT:  Head: Normocephalic and atraumatic.  Right Ear: External ear normal.  Left Ear: External ear normal.  Eyes: Right eye exhibits no discharge. Left eye exhibits no discharge. No scleral icterus.  Pulmonary/Chest: Effort normal.  Neurological: He is alert and oriented to person, place, and time.  Skin: Skin is warm and dry. Rash noted. Rash is papular and maculopapular. He is not diaphoretic.     Psychiatric: He has a normal mood and affect. His behavior is normal.    Lab Results  Component Value Date   WBC 5.8 01/24/2016   HGB 13.5 01/24/2016   HCT 40.7 01/24/2016   PLT 208.0 01/24/2016   GLUCOSE 94 01/31/2016   CHOL 180 01/31/2016   TRIG 52.0 01/31/2016   HDL 48.20 01/31/2016   LDLCALC 121 (H) 01/31/2016   ALT 10 01/31/2016   AST 14 01/31/2016   NA 138 01/31/2016   K 4.5 01/31/2016   CL 102 01/31/2016   CREATININE 1.06 01/31/2016   BUN 17 01/31/2016   CO2 31 01/31/2016   TSH 0.87 01/31/2016   PSA 0.37 01/31/2016    Dg Chest  2 View  Result Date: 12/17/2017 CLINICAL DATA:  Initial evaluation for chronic intermittent cough, congestion, shortness of breath. EXAM: CHEST - 2 VIEW COMPARISON:  Prior radiograph from 06/06/2012. FINDINGS: Cardiac and mediastinal silhouettes are stable in size and contour, and remain within normal limits. Mild aortic atherosclerosis. Lungs are hyperinflated with advanced changes of COPD. Prominent bullous emphysema at the right lung apex. No focal infiltrates. No pulmonary edema or pleural effusion. No pneumothorax. No acute osseous abnormality. IMPRESSION: 1. Hyperinflation with advanced underlying COPD. No active cardiopulmonary disease. 2. Aortic atherosclerosis. Electronically Signed   By:  Jeannine Boga M.D.   On: 12/17/2017 15:50    Assessment & Plan:   Diagnoses and all orders for this visit:  Allergic reaction, initial encounter -     methylPREDNISolone acetate (DEPO-MEDROL) injection 80 mg   I am having Guy Cooter SR. maintain his Ferrous Sulfate (IRON CR PO), ranitidine, sildenafil, Tiotropium Bromide-Olodaterol, and albuterol. We administered methylPREDNISolone acetate.  Meds ordered this encounter  Medications  . methylPREDNISolone acetate (DEPO-MEDROL) injection 80 mg     Follow-up: Return follow up with primary md this week if not better.Libby Maw, MD

## 2018-01-22 NOTE — Patient Instructions (Signed)
Contact Dermatitis Dermatitis is redness, soreness, and swelling (inflammation) of the skin. Contact dermatitis is a reaction to certain substances that touch the skin. You either touched something that irritated your skin, or you have allergies to something you touched. Follow these instructions at home: Skin Care  Moisturize your skin as needed.  Apply cool compresses to the affected areas.  Try taking a bath with: ? Epsom salts. Follow the instructions on the package. You can get these at a pharmacy or grocery store. ? Baking soda. Pour a small amount into the bath as told by your doctor. ? Colloidal oatmeal. Follow the instructions on the package. You can get this at a pharmacy or grocery store.  Try applying baking soda paste to your skin. Stir water into baking soda until it looks like paste.  Do not scratch your skin.  Bathe less often.  Bathe in lukewarm water. Avoid using hot water. Medicines  Take or apply over-the-counter and prescription medicines only as told by your doctor.  If you were prescribed an antibiotic medicine, take or apply your antibiotic as told by your doctor. Do not stop taking the antibiotic even if your condition starts to get better. General instructions  Keep all follow-up visits as told by your doctor. This is important.  Avoid the substance that caused your reaction. If you do not know what caused it, keep a journal to try to track what caused it. Write down: ? What you eat. ? What cosmetic products you use. ? What you drink. ? What you wear in the affected area. This includes jewelry.  If you were given a bandage (dressing), take care of it as told by your doctor. This includes when to change and remove it. Contact a doctor if:  You do not get better with treatment.  Your condition gets worse.  You have signs of infection such as: ? Swelling. ? Tenderness. ? Redness. ? Soreness. ? Warmth.  You have a fever.  You have new  symptoms. Get help right away if:  You have a very bad headache.  You have neck pain.  Your neck is stiff.  You throw up (vomit).  You feel very sleepy.  You see red streaks coming from the affected area.  Your bone or joint underneath the affected area becomes painful after the skin has healed.  The affected area turns darker.  You have trouble breathing. This information is not intended to replace advice given to you by your health care provider. Make sure you discuss any questions you have with your health care provider. Document Released: 02/08/2009 Document Revised: 09/19/2015 Document Reviewed: 08/29/2014 Elsevier Interactive Patient Education  2018 [employer]  

## 2018-01-25 ENCOUNTER — Other Ambulatory Visit: Payer: Self-pay | Admitting: Emergency Medicine

## 2018-01-26 ENCOUNTER — Encounter: Payer: Self-pay | Admitting: Gastroenterology

## 2018-01-27 ENCOUNTER — Emergency Department (HOSPITAL_COMMUNITY)
Admission: EM | Admit: 2018-01-27 | Discharge: 2018-01-28 | Disposition: A | Discharge status: Home / Self Care | Payer: 59 | Attending: Emergency Medicine | Admitting: Emergency Medicine

## 2018-01-27 ENCOUNTER — Other Ambulatory Visit: Payer: Self-pay

## 2018-01-27 ENCOUNTER — Encounter (HOSPITAL_COMMUNITY): Payer: Self-pay | Admitting: Emergency Medicine

## 2018-01-27 DIAGNOSIS — L509 Urticaria, unspecified: Secondary | ICD-10-CM | POA: Diagnosis present

## 2018-01-27 DIAGNOSIS — Z79899 Other long term (current) drug therapy: Secondary | ICD-10-CM | POA: Diagnosis not present

## 2018-01-27 DIAGNOSIS — J449 Chronic obstructive pulmonary disease, unspecified: Secondary | ICD-10-CM | POA: Diagnosis not present

## 2018-01-27 DIAGNOSIS — Z87891 Personal history of nicotine dependence: Secondary | ICD-10-CM | POA: Diagnosis not present

## 2018-01-27 MED ORDER — DIPHENHYDRAMINE HCL 25 MG PO CAPS
25.0000 mg | ORAL_CAPSULE | Freq: Once | ORAL | Status: AC
  Administered 2018-01-27: 25 mg via ORAL
  Filled 2018-01-27: qty 1

## 2018-01-27 MED ORDER — METHYLPREDNISOLONE SODIUM SUCC 125 MG IJ SOLR
80.0000 mg | Freq: Once | INTRAMUSCULAR | Status: AC
  Administered 2018-01-27: 80 mg via INTRAMUSCULAR
  Filled 2018-01-27: qty 2

## 2018-01-27 MED ORDER — FAMOTIDINE 20 MG PO TABS
20.0000 mg | ORAL_TABLET | Freq: Once | ORAL | Status: AC
  Administered 2018-01-27: 20 mg via ORAL
  Filled 2018-01-27: qty 1

## 2018-01-27 NOTE — ED Provider Notes (Signed)
[facility] EMERGENCY DEPARTMENT Provider Note   CSN: [id number] Arrival date & time: 01/27/18  2146     History   Chief Complaint Chief Complaint  Patient presents with  . Rash  . Urticaria    HPI Guy EICHEL SR. is a 62 y.o. male.  HPI   Patient is a 62 year old male with a history of COPD, GERD, hyperlipidemia who presents the emergency department today for evaluation of urticaria that has been present for the last 8 days.  Patient reports an itchy red rash to his bilateral upper extremities, bilateral lower extremities and trunk.  No associated fevers, chills, lip or tongue swelling, sore throat, throat swelling, difficulty breathing, or increased wheezing.  Denies any shortness of breath aside from his chronic shortness of breath from COPD.  He denies any new soaps, detergents, lotions, foods, pets, changes in his environment or medications.  States he was seen at [facility] on 01/23/18 injection at that time.  Since then he has had persistent urticaria.  He did not get a prescription for any medication at that time.  Past Medical History:  Diagnosis Date  . COPD (chronic obstructive pulmonary disease) ([place])   . GERD (gastroesophageal reflux disease)   . Hyperlipidemia     Patient Active Problem List   Diagnosis Date Noted  . History of tobacco use 01/19/2018  . Eczema 09/15/2017  . COPD (chronic obstructive pulmonary disease) ([place]) 01/29/2016  . Erectile dysfunction 04/11/2014  . GERD (gastroesophageal reflux disease) 04/11/2014  . HYPERLIPIDEMIA 04/18/2007  . INSOMNIA 04/18/2007    Past Surgical History:  Procedure Laterality Date  . COLONOSCOPY  08/02/2012   per Dr. Fuller Plan, adenomatous polyps, repeat in 5 yrs         Home Medications    Prior to Admission medications   Medication Sig Start Date End Date Taking? Authorizing Provider  albuterol (PROVENTIL HFA;VENTOLIN HFA) 108 (90 Base) MCG/ACT inhaler Inhale 2 puffs into the lungs  every 4 (four) hours as needed for wheezing or shortness of breath. 01/19/18   Collene Gobble, MD  Ferrous Sulfate (IRON CR PO) Take 1 each by mouth daily.      [provider]  predniSONE (DELTASONE) 50 MG tablet Take 1 tablet (50 mg total) by mouth daily for 5 days. 01/27/18 02/01/18  Gabriel Conry S, PA-C  ranitidine (ZANTAC) 150 MG capsule Take 150 mg by mouth as needed.     [provider]  sildenafil (REVATIO) 20 MG tablet TAKE 5 TABLETS BY MOUTH AS NEEDED 05/21/17   Laurey Morale, MD  umeclidinium-vilanterol (ANORO ELLIPTA) 62.5-25 MCG/INH AEPB Inhale 1 puff into the lungs daily. 01/26/18   Collene Gobble, MD    Family History Family History  Problem Relation Age of Onset  . Diabetes Father   . Kidney disease Father   . Diabetes Mother   . Mental illness Sister        committed suicide  . HIV Brother        died of AIDS  . Breast cancer Maternal Aunt        x2  . Ovarian cancer Sister   . Heart disease Maternal Aunt   . Heart disease Paternal Aunt   . Colon cancer Neg Hx   . Esophageal cancer Neg Hx   . Rectal cancer Neg Hx   . Stomach cancer Neg Hx     Social History Social History   Tobacco Use  . Smoking status: Former Smoker  Packs/day: 1.50    Years: 30.00    Pack years: 45.00    Types: Cigarettes    Last attempt to quit: 06/16/2012    Years since quitting: 5.6  . Smokeless tobacco: Never Used  Substance Use Topics  . Alcohol use: No    Alcohol/week: 0.0 standard drinks  . Drug use: No     Allergies   Patient has no known allergies.   Review of Systems Review of Systems  Constitutional: Negative for fever.  HENT: Negative for sore throat and trouble swallowing.        No lip swelling, tongue swelling, throat swelling, difficulty swallowing  Eyes: Negative for visual disturbance.  Respiratory: Negative for cough, shortness of breath and wheezing.   Cardiovascular: Negative for chest pain.  Gastrointestinal: Negative for  abdominal pain, constipation, diarrhea, nausea and vomiting.  Genitourinary: Negative for dysuria.  Musculoskeletal: Negative for back pain.  Skin: Positive for rash.  Neurological: Negative for headaches.   Physical Exam Updated Vital Signs BP (!) 143/107 (BP Location: Left Arm)   Pulse 92   Temp 97.7 F (36.5 C) (Oral)   Resp 16   Ht 5\' 11"  (1.803 m)   Wt 68.9 kg   SpO2 97%   BMI 21.20 kg/m   Physical Exam  Constitutional: He is oriented to person, place, and time. He appears well-developed and well-nourished. No distress.  HENT:  No swelling to the lips, tongue, or posterior oropharynx.  Normal voice.  Tolerating secretions.  Eyes: Conjunctivae are normal.  Cardiovascular: Normal rate, regular rhythm and normal heart sounds.  Pulmonary/Chest: Effort normal and breath sounds normal. No stridor. No respiratory distress. He has no wheezes.  Abdominal: Soft. Bowel sounds are normal. There is no tenderness.  Musculoskeletal: Normal range of motion.  Neurological: He is alert and oriented to person, place, and time.  Skin: Skin is warm and dry. Capillary refill takes less than 2 seconds.  Diffuse urticarial rash to the bilateral upper extremities, bilateral lower extremities abdomen and back.  No evidence of superinfection.  Psychiatric: He has a normal mood and affect.  Nursing note and vitals reviewed.   ED Treatments / Results  Labs (all labs ordered are listed, but only abnormal results are displayed) Labs Reviewed - No data to display  EKG None  Radiology No results found.  Procedures Procedures (including critical care time)  Medications Ordered in ED Medications  diphenhydrAMINE (BENADRYL) capsule 25 mg (25 mg Oral Given 01/27/18 2259)  famotidine (PEPCID) tablet 20 mg (20 mg Oral Given 01/27/18 2259)  methylPREDNISolone sodium succinate (SOLU-MEDROL) 125 mg/2 mL injection 80 mg (80 mg Intramuscular Given 01/27/18 2259)     Initial Impression / Assessment and  Plan / ED Course  I have reviewed the triage vital signs and the nursing notes.  Pertinent labs & imaging results that were available during my care of the patient were reviewed by me and considered in my medical decision making (see chart for details).     Final Clinical Impressions(s) / ED Diagnoses   Final diagnoses:  Urticaria   Rash consistent with urticaria. Patient denies any difficulty breathing or swallowing.  Pt has a patent airway without stridor and is handling secretions without difficulty; no angioedema. No blisters, no pustules, no warmth, no draining sinus tracts, no superficial abscesses, no bullous impetigo, no vesicles, no desquamation, no target lesions with dusky purpura or a central bulla. Not tender to touch. No concern for superimposed infection. No concern for SJS, TEN, TSS,  tick borne illness, syphilis or other life-threatening condition.  Patient was given Pepcid, Benadryl and IM steroids in the ED.  Upon reevaluation he states that he feels much improved.  Will discharge home with short course of steroids, pepcid and recommend Benadryl as needed for pruritis. Advised him to f/u with pcp later this week and to return to the ED for new or worsening infection. Pt and wife at bedside voice an understanding of the plan and reason to the return to the ED. All questions answered.  ED Discharge Orders         Ordered    predniSONE (DELTASONE) 50 MG tablet  Daily     01/28/18 0019           Rodney Booze, PA-C 01/28/18 0021    Orlie Dakin, MD 01/28/18 [phone]

## 2018-01-27 NOTE — Discharge Instructions (Addendum)
Please take all medications as directed.    Please take the steroids daily as directed on your discharge paperwork.  You may take Zantac twice daily as needed for itching and rash.  You may also take Benadryl at night for your symptoms.  Avoid working, driving or drinking alcohol while taking Benadryl as it can make you very drowsy.  Please follow-up with your primary care doctor next week for reevaluation.  Please return to the emergency department for any new or worsening symptoms in the meantime.

## 2018-01-27 NOTE — ED Triage Notes (Signed)
Pt reports a generalized rash/hives all over his body that started a few days ago. Pt went to the doctor Saturday and received a steroid injection and took Benadryl that has not helped, pt thinks the rash has gotten worse. Pt reports itchiness all over. Unsure of where rash came from.

## 2018-01-28 MED ORDER — PREDNISONE 50 MG PO TABS: 50.0000 mg | ORAL_TABLET | Freq: Every day | ORAL | 0 refills | Status: AC

## 2018-01-28 NOTE — ED Notes (Signed)
Patient left at this time with all belongings. 

## 2018-02-07 ENCOUNTER — Ambulatory Visit (INDEPENDENT_AMBULATORY_CARE_PROVIDER_SITE_OTHER): Payer: 59 | Admitting: Family Medicine

## 2018-02-07 ENCOUNTER — Encounter: Payer: Self-pay | Admitting: Family Medicine

## 2018-02-07 VITALS — BP 120/80 | HR 76 | Temp 97.5°F | Wt 151.2 lb

## 2018-02-07 DIAGNOSIS — L509 Urticaria, unspecified: Secondary | ICD-10-CM

## 2018-02-07 MED ORDER — PREDNISONE 10 MG PO TABS: ORAL_TABLET | ORAL | 0 refills | Status: DC

## 2018-02-07 MED ORDER — HYDROXYZINE HCL 25 MG PO TABS: 25.0000 mg | ORAL_TABLET | ORAL | 2 refills | Status: DC | PRN

## 2018-02-07 NOTE — Progress Notes (Signed)
   Subjective:    Patient ID: Guy BOMBERGER SR., male    DOB: 07/25/1955, 62 y.o.   MRN: [id number]  HPI Here for 2 and 1/2 weeks of hives. He developed an intensely itchy rash on the arms and legs and trunk. No recent medication changes, no new soaps or detergents, etc. He has taken a lot of Nebadryl at home. He was seen in the weekend clinic on 01-22-18 and he was given 80 mg of DepoMedrol. This did not have much of an effect. Then he went to the ER on 01-27-18 and was given 125 mg of DepoMedrol. This time he had good relief for 24 hours, but then the rash returned. No lip swelling or SOB.    Review of Systems  Constitutional: Negative.   Respiratory: Negative.   Cardiovascular: Negative.   Skin: Positive for rash.  Neurological: Negative.        Objective:   Physical Exam  Constitutional: He is oriented to person, place, and time. He appears well-developed and well-nourished.  Cardiovascular: Normal rate, regular rhythm, normal heart sounds and intact distal pulses.  Pulmonary/Chest: Effort normal and breath sounds normal.  Neurological: He is alert and oriented to person, place, and time.  Skin:  Diffuse maculopapular red rash on arms and legs and trunk           Assessment & Plan:  Hives, treat with an 18 day taper of Prednisone, beginning with 60 mg a day. Add Hydroxyzine prn.  [name], MD

## 2018-02-09 ENCOUNTER — Ambulatory Visit (AMBULATORY_SURGERY_CENTER): Payer: 59 | Admitting: Gastroenterology

## 2018-02-09 ENCOUNTER — Encounter: Payer: Self-pay | Admitting: Gastroenterology

## 2018-02-09 VITALS — BP 107/78 | HR 84 | Temp 99.3°F | Resp 14 | Ht 71.5 in | Wt 151.0 lb

## 2018-02-09 DIAGNOSIS — K635 Polyp of colon: Secondary | ICD-10-CM | POA: Diagnosis not present

## 2018-02-09 DIAGNOSIS — Z86010 Personal history of colonic polyps: Secondary | ICD-10-CM

## 2018-02-09 DIAGNOSIS — D124 Benign neoplasm of descending colon: Secondary | ICD-10-CM

## 2018-02-09 DIAGNOSIS — D125 Benign neoplasm of sigmoid colon: Secondary | ICD-10-CM

## 2018-02-09 HISTORY — PX: COLONOSCOPY: SHX174

## 2018-02-09 MED ORDER — SODIUM CHLORIDE 0.9 % IV SOLN: 500.0000 mL | Freq: Once | INTRAVENOUS | Status: DC

## 2018-02-09 NOTE — Progress Notes (Signed)
Pt's states no medical or surgical changes since previsit or office visit. 

## 2018-02-09 NOTE — Progress Notes (Signed)
Called to room to assist during endoscopic procedure.  Patient ID and intended procedure confirmed with present staff. Received instructions for my participation in the procedure from the performing physician.  

## 2018-02-09 NOTE — Progress Notes (Signed)
A/ox3 pleased with MAC, report to RN 

## 2018-02-09 NOTE — Patient Instructions (Signed)
Handouts:  Polyps, Hemorrhoids, and Diverticula  YOU HAD AN ENDOSCOPIC PROCEDURE TODAY AT THE [facility]:   Refer to the procedure report that was given to you for any specific questions about what was found during the examination.  If the procedure report does not answer your questions, please call your gastroenterologist to clarify.  If you requested that your care partner not be given the details of your procedure findings, then the procedure report has been included in a sealed envelope for you to review at your convenience later.  YOU SHOULD EXPECT: Some feelings of bloating in the abdomen. Passage of more gas than usual.  Walking can help get rid of the air that was put into your GI tract during the procedure and reduce the bloating. If you had a lower endoscopy (such as a colonoscopy or flexible sigmoidoscopy) you may notice spotting of blood in your stool or on the toilet paper. If you underwent a bowel prep for your procedure, you may not have a normal bowel movement for a few days.  Please Note:  You might notice some irritation and congestion in your nose or some drainage.  This is from the oxygen used during your procedure.  There is no need for concern and it should clear up in a day or so.  SYMPTOMS TO REPORT IMMEDIATELY:   Following lower endoscopy (colonoscopy or flexible sigmoidoscopy):  Excessive amounts of blood in the stool  Significant tenderness or worsening of abdominal pains  Swelling of the abdomen that is new, acute  Fever of 100F or higher  For urgent or emergent issues, a gastroenterologist can be reached at any hour by calling [phone].   DIET:  We do recommend a small meal at first, but then you may proceed to your regular diet.  Drink plenty of fluids but you should avoid alcoholic beverages for 24 hours.  ACTIVITY:  You should plan to take it easy for the rest of today and you should NOT DRIVE or use heavy machinery until tomorrow (because of  the sedation medicines used during the test).    FOLLOW UP: Our staff will call the number listed on your records the next business day following your procedure to check on you and address any questions or concerns that you may have regarding the information given to you following your procedure. If we do not reach you, we will leave a message.  However, if you are feeling well and you are not experiencing any problems, there is no need to return our call.  We will assume that you have returned to your regular daily activities without incident.  If any biopsies were taken you will be contacted by phone or by letter within the next 1-3 weeks.  Please call us at [phone] if you have not heard about the biopsies in 3 weeks.    SIGNATURES/CONFIDENTIALITY: You and/or your care partner have signed paperwork which will be entered into your electronic medical record.  These signatures attest to the fact that that the information above on your After Visit Summary has been reviewed and is understood.  Full responsibility of the confidentiality of this discharge information lies with you and/or your care-partner. 

## 2018-02-09 NOTE — Op Note (Signed)
[place] Patient Name: Guy Rana Sr. Procedure Date: 02/09/2018 2:15 PM MRN: [id number] Endoscopist: Ladene Artist , MD Age: 62 Referring MD:  Date of Birth: [birth year] Gender: Male Account #: [id number] Procedure:                Colonoscopy Indications:              High risk colon cancer surveillance: Personal                            history of traditional serrated adenoma of the colon Medicines:                Monitored Anesthesia Care Procedure:                Pre-Anesthesia Assessment:                           - Prior to the procedure, a History and Physical                            was performed, and patient medications and                            allergies were reviewed. The patient's tolerance of                            previous anesthesia was also reviewed. The risks                            and benefits of the procedure and the sedation                            options and risks were discussed with the patient.                            All questions were answered, and informed consent                            was obtained. Prior Anticoagulants: The patient has                            taken no previous anticoagulant or antiplatelet                            agents. ASA Grade Assessment: II - A patient with                            mild systemic disease. After reviewing the risks                            and benefits, the patient was deemed in                            satisfactory condition to undergo the procedure.  After obtaining informed consent, the colonoscope                            was passed under direct vision. Throughout the                            procedure, the patient's blood pressure, pulse, and                            oxygen saturations were monitored continuously. The                            Colonoscope was introduced through the anus and   advanced to the the cecum, identified by                            appendiceal orifice and ileocecal valve. The                            ileocecal valve, appendiceal orifice, and rectum                            were photographed. The quality of the bowel                            preparation was good. The colonoscopy was performed                            without difficulty. The patient tolerated the                            procedure well. Scope In: 2:27:05 PM Scope Out: 2:42:02 PM Scope Withdrawal Time: 0 hours 13 minutes 2 seconds  Total Procedure Duration: 0 hours 14 minutes 57 seconds  Findings:                 The perianal and digital rectal examinations were                            normal.                           Three sessile polyps were found in the sigmoid                            colon (1) and descending colon (2). The polyps were                            4 to 5 mm in size. These polyps were removed with a                            cold biopsy forceps. Resection and retrieval were                            complete.  One small angiodysplastic lesion without bleeding                            was found in the ascending colon.                           Multiple medium-mouthed diverticula were found in                            the left colon. There was no evidence of                            diverticular bleeding.                           Internal hemorrhoids were found during                            retroflexion. The hemorrhoids were small and Grade                            I (internal hemorrhoids that do not prolapse).                           The exam was otherwise without abnormality on                            direct and retroflexion views. Complications:            No immediate complications. Estimated blood loss:                            None. Estimated Blood Loss:     Estimated blood loss: none. Impression:                - Three 4 to 5 mm polyps in the sigmoid colon and                            in the descending colon, removed with a cold biopsy                            forceps. Resected and retrieved.                           - Small ascending colon AVM.                           - Moderate diverticulosis in the left colon.                           - Internal hemorrhoids.                           - The examination was otherwise normal on direct  and retroflexion views. Recommendation:           - Repeat colonoscopy in 5 years for surveillance.                           - Patient has a contact number available for                            emergencies. The signs and symptoms of potential                            delayed complications were discussed with the                            patient. Return to normal activities tomorrow.                            Written discharge instructions were provided to the                            patient.                           - High fiber diet.                           - Continue present medications.                           - Await pathology results. Ladene Artist, MD 02/09/2018 2:47:10 PM This report has been signed electronically.

## 2018-02-10 ENCOUNTER — Telehealth: Payer: Self-pay

## 2018-02-10 NOTE — Telephone Encounter (Signed)
Left message

## 2018-02-10 NOTE — Telephone Encounter (Signed)
No answer, left message to call back later today, B.Merinda Victorino RN. 

## 2018-02-23 ENCOUNTER — Encounter: Payer: Self-pay | Admitting: Gastroenterology

## 2018-06-06 ENCOUNTER — Other Ambulatory Visit: Payer: Self-pay | Admitting: Family Medicine

## 2018-06-23 ENCOUNTER — Other Ambulatory Visit: Payer: Self-pay | Admitting: Family Medicine

## 2018-06-23 NOTE — Telephone Encounter (Signed)
Dr. Fry please advise on refill of med   Thanks  

## 2018-06-24 MED ORDER — SILDENAFIL CITRATE 20 MG PO TABS: ORAL_TABLET | ORAL | 2 refills | Status: DC

## 2018-07-01 ENCOUNTER — Ambulatory Visit (INDEPENDENT_AMBULATORY_CARE_PROVIDER_SITE_OTHER): Payer: 59 | Admitting: Family Medicine

## 2018-07-01 ENCOUNTER — Other Ambulatory Visit: Payer: Self-pay

## 2018-07-01 ENCOUNTER — Encounter: Payer: Self-pay | Admitting: Family Medicine

## 2018-07-01 VITALS — BP 110/82 | HR 82 | Temp 98.4°F | Ht 71.0 in | Wt 154.4 lb

## 2018-07-01 DIAGNOSIS — Z23 Encounter for immunization: Secondary | ICD-10-CM

## 2018-07-01 DIAGNOSIS — Z0000 Encounter for general adult medical examination without abnormal findings: Secondary | ICD-10-CM | POA: Diagnosis not present

## 2018-07-01 LAB — POC URINALSYSI DIPSTICK (AUTOMATED)
Bilirubin, UA: NEGATIVE
Blood, UA: NEGATIVE
Glucose, UA: NEGATIVE
Ketones, UA: NEGATIVE
Leukocytes, UA: NEGATIVE
Nitrite, UA: NEGATIVE
Protein, UA: NEGATIVE
Spec Grav, UA: 1.015 (ref 1.010–1.025)
Urobilinogen, UA: 0.2 E.U./dL
pH, UA: 6 (ref 5.0–8.0)

## 2018-07-01 MED ORDER — SILDENAFIL CITRATE 20 MG PO TABS: ORAL_TABLET | ORAL | 5 refills | Status: DC

## 2018-07-01 NOTE — Progress Notes (Signed)
   Subjective:    Patient ID: Guy SOFIA SR., male    DOB: 1955/06/08, 63 y.o.   MRN: [id number]  HPI Here for a well exam. He feels well and has no concerns.    Review of Systems  Constitutional: Negative.   HENT: Negative.   Eyes: Negative.   Respiratory: Negative.   Cardiovascular: Negative.   Gastrointestinal: Negative.   Genitourinary: Negative.   Musculoskeletal: Negative.   Skin: Negative.   Neurological: Negative.   Psychiatric/Behavioral: Negative.        Objective:   Physical Exam Constitutional:      General: He is not in acute distress.    Appearance: He is well-developed. He is not diaphoretic.  HENT:     Head: Normocephalic and atraumatic.     Right Ear: External ear normal.     Left Ear: External ear normal.     Nose: Nose normal.     Mouth/Throat:     Pharynx: No oropharyngeal exudate.  Eyes:     General: No scleral icterus.       Right eye: No discharge.        Left eye: No discharge.     Conjunctiva/sclera: Conjunctivae normal.     Pupils: Pupils are equal, round, and reactive to light.  Neck:     Musculoskeletal: Neck supple.     Thyroid: No thyromegaly.     Vascular: No JVD.     Trachea: No tracheal deviation.  Cardiovascular:     Rate and Rhythm: Normal rate and regular rhythm.     Heart sounds: Normal heart sounds. No murmur. No friction rub. No gallop.   Pulmonary:     Effort: Pulmonary effort is normal. No respiratory distress.     Breath sounds: Normal breath sounds. No wheezing or rales.  Chest:     Chest wall: No tenderness.  Abdominal:     General: Bowel sounds are normal. There is no distension.     Palpations: Abdomen is soft. There is no mass.     Tenderness: There is no abdominal tenderness. There is no guarding or rebound.  Genitourinary:    Penis: Normal. No tenderness.      Scrotum/Testes: Normal.     Prostate: Normal.     Rectum: Normal. Guaiac result negative.  Musculoskeletal: Normal range of motion.      General: No tenderness.  Lymphadenopathy:     Cervical: No cervical adenopathy.  Skin:    General: Skin is warm and dry.     Coloration: Skin is not pale.     Findings: No erythema or rash.  Neurological:     Mental Status: He is alert and oriented to person, place, and time.     Cranial Nerves: No cranial nerve deficit.     Motor: No abnormal muscle tone.     Coordination: Coordination normal.     Deep Tendon Reflexes: Reflexes are normal and symmetric. Reflexes normal.  Psychiatric:        Behavior: Behavior normal.        Thought Content: Thought content normal.        Judgment: Judgment normal.           Assessment & Plan:  Well exam. We discussed diet and exercise. Get fasting labs. Given a flu shot. He will see Dr. Lamonte Sakai later this month.  [name], MD

## 2018-07-02 LAB — LIPID PANEL
Cholesterol: 202 mg/dL — ABNORMAL HIGH (ref <200)
HDL: 54 mg/dL (ref >=40)
LDL Cholesterol (Calc): 132 mg/dL (calc) — ABNORMAL HIGH
Non-HDL Cholesterol (Calc): 148 mg/dL (calc) — ABNORMAL HIGH (ref <130)
Total CHOL/HDL Ratio: 3.7 (calc) (ref <5.0)
Triglycerides: 64 mg/dL (ref <150)

## 2018-07-02 LAB — CBC WITH DIFFERENTIAL/PLATELET
Absolute Monocytes: 526 cells/uL (ref 200–950)
Basophils Absolute: 73 cells/uL (ref 0–200)
Basophils Relative: 1.3 %
Eosinophils Absolute: 162 cells/uL (ref 15–500)
Eosinophils Relative: 2.9 %
HCT: 41.5 % (ref 38.5–50.0)
Hemoglobin: 13.7 g/dL (ref 13.2–17.1)
Lymphs Abs: 907 cells/uL (ref 850–3900)
MCH: 29.4 pg (ref 27.0–33.0)
MCHC: 33 g/dL (ref 32.0–36.0)
MCV: 89.1 fL (ref 80.0–100.0)
MPV: 10 fL (ref 7.5–12.5)
Monocytes Relative: 9.4 %
Neutro Abs: 3931 cells/uL (ref 1500–7800)
Neutrophils Relative %: 70.2 %
Platelets: 215 10*3/uL (ref 140–400)
RBC: 4.66 10*6/uL (ref 4.20–5.80)
RDW: 11.8 % (ref 11.0–15.0)
Total Lymphocyte: 16.2 %
WBC: 5.6 10*3/uL (ref 3.8–10.8)

## 2018-07-02 LAB — BASIC METABOLIC PANEL
BUN: 13 mg/dL (ref 7–25)
CO2: 27 mmol/L (ref 20–32)
Calcium: 9.4 mg/dL (ref 8.6–10.3)
Chloride: 104 mmol/L (ref 98–110)
Creat: 1.03 mg/dL (ref 0.70–1.25)
Glucose, Bld: 96 mg/dL (ref 65–99)
Potassium: 4.3 mmol/L (ref 3.5–5.3)
Sodium: 140 mmol/L (ref 135–146)

## 2018-07-02 LAB — HEPATIC FUNCTION PANEL
AG Ratio: 2 (calc) (ref 1.0–2.5)
ALT: 13 U/L (ref 9–46)
AST: 17 U/L (ref 10–35)
Albumin: 4.3 g/dL (ref 3.6–5.1)
Alkaline phosphatase (APISO): 55 U/L (ref 35–144)
Bilirubin, Direct: 0.1 mg/dL (ref 0.0–0.2)
Globulin: 2.1 g/dL (calc) (ref 1.9–3.7)
Indirect Bilirubin: 0.5 mg/dL (calc) (ref 0.2–1.2)
Total Bilirubin: 0.6 mg/dL (ref 0.2–1.2)
Total Protein: 6.4 g/dL (ref 6.1–8.1)

## 2018-07-02 LAB — TSH: TSH: 1 mIU/L (ref 0.40–4.50)

## 2018-07-02 LAB — PSA: PSA: 0.7 ng/mL (ref <=4.0)

## 2018-07-04 ENCOUNTER — Encounter: Payer: Self-pay | Admitting: *Deleted

## 2018-07-05 ENCOUNTER — Encounter: Payer: Self-pay | Admitting: Family Medicine

## 2018-07-05 NOTE — Telephone Encounter (Signed)
Dr. Fry please advise. Thanks  

## 2018-07-06 ENCOUNTER — Ambulatory Visit: Payer: 59 | Admitting: Family Medicine

## 2018-07-06 ENCOUNTER — Encounter: Payer: Self-pay | Admitting: Family Medicine

## 2018-07-06 ENCOUNTER — Other Ambulatory Visit: Payer: Self-pay

## 2018-07-06 VITALS — BP 110/64 | HR 108 | Temp 98.4°F | Wt 154.1 lb

## 2018-07-06 DIAGNOSIS — J019 Acute sinusitis, unspecified: Secondary | ICD-10-CM

## 2018-07-06 DIAGNOSIS — R509 Fever, unspecified: Secondary | ICD-10-CM | POA: Diagnosis not present

## 2018-07-06 LAB — POCT INFLUENZA A/B
Influenza A, POC: NEGATIVE
Influenza B, POC: NEGATIVE

## 2018-07-06 MED ORDER — AZITHROMYCIN 250 MG PO TABS: ORAL_TABLET | ORAL | 0 refills | Status: DC

## 2018-07-06 NOTE — Progress Notes (Signed)
   Subjective:    Patient ID: Guy OPDAHL SR., male    DOB: 1955/09/04, 63 y.o.   MRN: [id number]  HPI Here for one week of stuffy head, PND, ST, and a dry cough. No fever.    Review of Systems  Constitutional: Negative.   HENT: Positive for congestion, postnasal drip and sore throat.   Eyes: Negative.   Respiratory: Positive for cough.        Objective:   Physical Exam Constitutional:      Appearance: Normal appearance. He is not ill-appearing.  HENT:     Right Ear: Tympanic membrane and ear canal normal.     Left Ear: Tympanic membrane and ear canal normal.     Nose: Nose normal.     Mouth/Throat:     Pharynx: Oropharynx is clear.  Eyes:     Conjunctiva/sclera: Conjunctivae normal.  Pulmonary:     Effort: Pulmonary effort is normal.     Breath sounds: Normal breath sounds.  Lymphadenopathy:     Cervical: No cervical adenopathy.  Neurological:     Mental Status: He is alert.           Assessment & Plan:  Sinusitis, treat with a Zpack. Written out of work from 07-04-18 until 07-08-18. Guy Penna, MD

## 2018-07-06 NOTE — Telephone Encounter (Signed)
First of all, reassure him he did NOT get sick from the shot since that is impossible. For the symptoms drink fluids and take Ibuprofen.

## 2018-07-12 ENCOUNTER — Telehealth: Payer: Self-pay | Admitting: *Deleted

## 2018-07-12 NOTE — Telephone Encounter (Signed)
Form for a prior auth for Sildenafil 20mg  completed and faxed to [facility] at [phone].  Fax and message forwarded to R[place] for follow up.

## 2018-09-01 NOTE — Telephone Encounter (Signed)
PA has been denied. Spoke with the patients pharmacy and was informed that that the patient was able to use a discount card and has picked up his medication. Nothing further needed.

## 2018-12-06 ENCOUNTER — Encounter: Payer: Self-pay | Admitting: Emergency Medicine

## 2018-12-06 ENCOUNTER — Other Ambulatory Visit: Payer: Self-pay

## 2018-12-06 ENCOUNTER — Ambulatory Visit: Payer: 59 | Admitting: Emergency Medicine

## 2018-12-06 DIAGNOSIS — J439 Emphysema, unspecified: Secondary | ICD-10-CM | POA: Diagnosis not present

## 2018-12-06 NOTE — Progress Notes (Signed)
Subjective:    Patient ID: Guy TIMMONS SR., male    DOB: 1956-02-28, 63 y.o.   MRN: [id number]  Shortness of Breath Pertinent negatives include no ear pain, fever, headaches, leg swelling, rash, rhinorrhea, sore throat, vomiting or wheezing.    ROV 01/19/18 --Mr. Guy Carlson is 38 and is a former smoker (quit 2014), has a history of COPD.  I met him after he was treated for an acute exacerbation in July.  At our initial visit we did a trial changing Symbicort to Stiolto to see if you get more benefit.  He reports that his breathing is better, cough frequency is better.  He underwent pulmonary function testing today which I have reviewed.  This shows very severe obstruction with an FEV1 of 1.27 L (43% predicted), no bronchodilator response, hyperinflated lung volumes and a decreased diffusion capacity that does not fully correct when adjusted for his alveolar volume.  He did not desaturate with ambulation at our initial visit. He does not have an albuterol right now.   ROV 12/06/2018 --63 year old gentleman, former smoker with COPD.  He has an FEV1 of 1.27 L (43% predicted), hyperinflation and diffusion defect.  I last saw him approximately 1 year ago and he was using Stiolto.  He had to stop this due to cost.  He has been able to start Trelegy through his at work physician, was better for his insurance. He was also given some Advair HFA, which he has used some as a rescue instead of albuterol. He believes that he is getting more benefit from the Advair than Trelegy. Stiolto was the best. Now he has just retired, just got medicare, so we aren't sure which will be most affordable going forward. No cough, minimal mucous to clear, no wheeze. No exacerbations since last time.    Review of Systems  Constitutional: Negative for fever and unexpected weight change.  HENT: Positive for congestion. Negative for dental problem, ear pain, nosebleeds, postnasal drip, rhinorrhea, sinus pressure, sneezing,  sore throat and trouble swallowing.   Eyes: Negative for redness and itching.  Respiratory: Positive for cough and shortness of breath. Negative for chest tightness and wheezing.   Cardiovascular: Negative for palpitations and leg swelling.  Gastrointestinal: Negative for nausea and vomiting.  Genitourinary: Negative for dysuria.  Musculoskeletal: Negative for joint swelling.  Skin: Negative for rash.  Neurological: Negative for headaches.  Hematological: Does not bruise/bleed easily.  Psychiatric/Behavioral: Negative for dysphoric mood. The patient is not nervous/anxious.     Past Medical History:  Diagnosis Date  . COPD (chronic obstructive pulmonary disease) ([place])    sees Dr. Baltazar Apo   . GERD (gastroesophageal reflux disease)   . Hyperlipidemia      Family History  Problem Relation Age of Onset  . Diabetes Father   . Kidney disease Father   . Diabetes Mother   . Mental illness Sister        committed suicide  . HIV Brother        died of AIDS  . Breast cancer Maternal Aunt        x2  . Ovarian cancer Sister   . Heart disease Maternal Aunt   . Heart disease Paternal Aunt   . Colon cancer Neg Hx   . Esophageal cancer Neg Hx   . Rectal cancer Neg Hx   . Stomach cancer Neg Hx      Social History   Socioeconomic History  . Marital status: Married    Spouse name:  Not on file  . Number of children: 3  . Years of education: Not on file  . Highest education level: Not on file  Occupational History    Employer: [employer]  Social Needs  . Financial resource strain: Not on file  . Food insecurity    Worry: Not on file    Inability: Not on file  . Transportation needs    Medical: Not on file    Non-medical: Not on file  Tobacco Use  . Smoking status: Former Smoker    Packs/day: 1.50    Years: 30.00    Pack years: 45.00    Types: Cigarettes    Quit date: 06/16/2012    Years since quitting: 6.4  . Smokeless tobacco: Never Used  Substance and Sexual  Activity  . Alcohol use: No    Alcohol/week: 0.0 standard drinks  . Drug use: No  . Sexual activity: Not on file  Lifestyle  . Physical activity    Days per week: Not on file    Minutes per session: Not on file  . Stress: Not on file  Relationships  . Social Herbalist on phone: Not on file    Gets together: Not on file    Attends religious service: Not on file    Active member of club or organization: Not on file    Attends meetings of clubs or organizations: Not on file    Relationship status: Not on file  . Intimate partner violence    Fear of current or ex partner: Not on file    Emotionally abused: Not on file    Physically abused: Not on file    Forced sexual activity: Not on file  Other Topics Concern  . Not on file  Social History Narrative  . 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   No Known Allergies   Outpatient Medications Prior to Visit  Medication Sig Dispense Refill  . albuterol (PROVENTIL HFA;VENTOLIN HFA) 108 (90 Base) MCG/ACT inhaler Inhale 2 puffs into the lungs every 4 (four) hours as needed for wheezing or shortness of breath. 1 Inhaler 5  . Ferrous Sulfate (IRON CR PO) Take 1 each by mouth daily.      . Fluticasone-Umeclidin-Vilant (TRELEGY ELLIPTA) 100-62.5-25 MCG/INH AEPB Inhale 1 puff into the lungs daily.    . hydrOXYzine (ATARAX/VISTARIL) 25 MG tablet Take 1 tablet (25 mg total) by mouth every 4 (four) hours as needed for itching. 60 tablet 2  . ranitidine (ZANTAC) 150 MG capsule Take 150 mg by mouth as needed.     . sildenafil (REVATIO) 20 MG tablet Take up to 5 tablets by mouth 50 tablet 5  . umeclidinium-vilanterol (ANORO ELLIPTA) 62.5-25 MCG/INH AEPB Inhale 1 puff into the lungs daily. 60 each 5  . azithromycin (ZITHROMAX) 250 MG tablet As directed 6 tablet 0  . predniSONE (DELTASONE) 10 MG tablet Take 6 tabs a day for 3 days, then 5 a day for 3 days, then 4 a day for 3  days, then 3 a day for 3 days, then 2 a day for 3 days, then 1 a day for 3 days, then stop 70 tablet 0   No facility-administered medications prior to visit.         Objective:   Physical Exam  Vitals:   12/06/18 0956  BP: 120/80  Pulse: 91  SpO2: 98%  Weight: 153 lb (  69.4 kg)  Height: 5' 11.5" (1.816 m)   Gen: Pleasant, thin, in no distress,  normal affect  ENT: No lesions,  mouth clear,  oropharynx clear, no postnasal drip  Neck: No JVD, no stridor  Lungs: No use of accessory muscles, no wheeze or crackles  Cardiovascular: RRR, heart sounds normal, no murmur or gallops, no peripheral edema  Musculoskeletal: No deformities, no cyanosis or clubbing  Neuro: alert, non focal  Skin: Warm, no lesions or rash    Assessment & Plan:  COPD (chronic obstructive pulmonary disease) ([place]) He thought Stiolto was the most effective.  He currently has both Trelegy and Advair.  Of these 2 he prefers the Advair.  Ultimately I would like to try to get him back on Stiolto but will have to assess the cost once he is fully on Medicare.  Stop Trelegy for now. Use your Advair 2 puffs twice a day.  Use this on a schedule every day.  Remember to rinse and gargle after using. Keep your albuterol available to use 2 puffs if needed for shortness of breath, chest tightness, wheezing.  This is your rescue medicine in case you are having bothersome symptoms. We will work on trying to get you back on Stiolto once you are active on Medicare.  Follow-up in 2 months to discuss this. Get the flu shot in the fall. Follow with Dr. Lamonte Sakai in 2 months or sooner if you have any problems  Baltazar Apo, MD, PhD 12/06/2018, 10:19 AM [facility] [phone] or if no answer [phone]

## 2018-12-06 NOTE — Assessment & Plan Note (Signed)
He thought Stiolto was the most effective.  He currently has both Trelegy and Advair.  Of these 2 he prefers the Advair.  Ultimately I would like to try to get him back on Stiolto but will have to assess the cost once he is fully on Medicare.  Stop Trelegy for now. Use your Advair 2 puffs twice a day.  Use this on a schedule every day.  Remember to rinse and gargle after using. Keep your albuterol available to use 2 puffs if needed for shortness of breath, chest tightness, wheezing.  This is your rescue medicine in case you are having bothersome symptoms. We will work on trying to get you back on Stiolto once you are active on Medicare.  Follow-up in 2 months to discuss this. Get the flu shot in the fall. Follow with Dr. Lamonte Sakai in 2 months or sooner if you have any problems

## 2018-12-06 NOTE — Patient Instructions (Signed)
Stop Trelegy for now. Use your Advair 2 puffs twice a day.  Use this on a schedule every day.  Remember to rinse and gargle after using. Keep your albuterol available to use 2 puffs if needed for shortness of breath, chest tightness, wheezing.  This is your rescue medicine in case you are having bothersome symptoms. We will work on trying to get you back on Stiolto once you are active on Medicare.  Follow-up in 2 months to discuss this. Get the flu shot in the fall. Follow with Dr. Lamonte Sakai in 2 months or sooner if you have any problems.

## 2019-02-07 ENCOUNTER — Ambulatory Visit (INDEPENDENT_AMBULATORY_CARE_PROVIDER_SITE_OTHER): Payer: Medicare Other | Admitting: Emergency Medicine

## 2019-02-07 ENCOUNTER — Encounter: Payer: Self-pay | Admitting: Emergency Medicine

## 2019-02-07 ENCOUNTER — Other Ambulatory Visit: Payer: Self-pay

## 2019-02-07 VITALS — BP 120/84 | HR 91 | Ht 72.0 in | Wt 158.0 lb

## 2019-02-07 DIAGNOSIS — J449 Chronic obstructive pulmonary disease, unspecified: Secondary | ICD-10-CM

## 2019-02-07 MED ORDER — STIOLTO RESPIMAT 2.5-2.5 MCG/ACT IN AERS: 2.0000 | INHALATION_SPRAY | Freq: Every day | RESPIRATORY_TRACT | 0 refills | Status: DC

## 2019-02-07 NOTE — Progress Notes (Signed)
Patient seen in the office today and instructed on use of Stiolto Respimat.  Patient expressed understanding and demonstrated technique.  

## 2019-02-07 NOTE — Patient Instructions (Signed)
Agree with working to get your Medicare prescription drug coverage soon as possible.  Once you have this please call us to let us know so that we can reorder your maintenance breathing medications through your pharmacy. We will give a sample of Stiolto for now.  Use 2 puffs once daily every day You would probably benefit from the flu shot.  Please let us know if you change your mind about getting this. Follow with Dr Lamonte Sakai in 3 months or sooner if you have any problems.

## 2019-02-07 NOTE — Assessment & Plan Note (Signed)
We need him to get his Medicare prescription drug coverage so that we can reestablish bronchodilator therapy.  He would benefit the most from long-acting scheduled bronchodilators.  If we are unable to arrange, he cannot get the Medicare prescription drug coverage then I will switch him over to scheduled nebulizer therapy which can be covered through Medicare part B.  We can talk about this next time.  Agree with working to get your Medicare prescription drug coverage soon as possible.  Once you have this please call us to let us know so that we can reorder your maintenance breathing medications through your pharmacy. We will give a sample of Stiolto for now.  Use 2 puffs once daily every day You would probably benefit from the flu shot.  Please let us know if you change your mind about getting this. Follow with Dr Lamonte Sakai in 3 months or sooner if you have any problems.

## 2019-02-07 NOTE — Progress Notes (Signed)
Subjective:    Patient ID: DHAVAL WOO SR., male    DOB: 06-14-1955, 63 y.o.   MRN: [id number]  Shortness of Breath Pertinent negatives include no ear pain, fever, headaches, leg swelling, rash, rhinorrhea, sore throat, vomiting or wheezing.    ROV 01/19/18 --Mr. Hunzeker is 1 and is a former smoker (quit 2014), has a history of COPD.  I met him after he was treated for an acute exacerbation in July.  At our initial visit we did a trial changing Symbicort to Stiolto to see if you get more benefit.  He reports that his breathing is better, cough frequency is better.  He underwent pulmonary function testing today which I have reviewed.  This shows very severe obstruction with an FEV1 of 1.27 L (43% predicted), no bronchodilator response, hyperinflated lung volumes and a decreased diffusion capacity that does not fully correct when adjusted for his alveolar volume.  He did not desaturate with ambulation at our initial visit. He does not have an albuterol right now.   ROV 12/06/2018 --63 year old gentleman, former smoker with COPD.  He has an FEV1 of 1.27 L (43% predicted), hyperinflation and diffusion defect.  I last saw him approximately 1 year ago and he was using Stiolto.  He had to stop this due to cost.  He has been able to start Trelegy through his at work physician, was better for his insurance. He was also given some Advair HFA, which he has used some as a rescue instead of albuterol. He believes that he is getting more benefit from the Advair than Trelegy. Stiolto was the best. Now he has just retired, just got medicare, so we aren't sure which will be most affordable going forward. No cough, minimal mucous to clear, no wheeze. No exacerbations since last time.   ROV 02/07/2019 -follow-up visit for Mr. Edelen, 63 year old former smoker with severe COPD.  We have had some difficulty getting him on a stable bronchodilator regimen due to cost.  He just turned 63, is now on Medicare but  did not know that he needed to get a medication supplement policy.  He has not had any drug coverage and has been trying to stretch out his Advair.  He has now run out and is not on any scheduled bronchodilators.  He reports that he is working with his wife on getting prescription drug coverage. He reports that his breathing is fairly stable, he has curtailed his activity. He is interested in trying to exercise more.     Review of Systems  Constitutional: Negative for fever and unexpected weight change.  HENT: Positive for congestion. Negative for dental problem, ear pain, nosebleeds, postnasal drip, rhinorrhea, sinus pressure, sneezing, sore throat and trouble swallowing.   Eyes: Negative for redness and itching.  Respiratory: Positive for cough and shortness of breath. Negative for chest tightness and wheezing.   Cardiovascular: Negative for palpitations and leg swelling.  Gastrointestinal: Negative for nausea and vomiting.  Genitourinary: Negative for dysuria.  Musculoskeletal: Negative for joint swelling.  Skin: Negative for rash.  Neurological: Negative for headaches.  Hematological: Does not bruise/bleed easily.  Psychiatric/Behavioral: Negative for dysphoric mood. The patient is not nervous/anxious.     Past Medical History:  Diagnosis Date  . COPD (chronic obstructive pulmonary disease) ([place])    sees Dr. Baltazar Apo   . GERD (gastroesophageal reflux disease)   . Hyperlipidemia      Family History  Problem Relation Age of Onset  . Diabetes Father   .  Kidney disease Father   . Diabetes Mother   . Mental illness Sister        committed suicide  . HIV Brother        died of AIDS  . Breast cancer Maternal Aunt        x2  . Ovarian cancer Sister   . Heart disease Maternal Aunt   . Heart disease Paternal Aunt   . Colon cancer Neg Hx   . Esophageal cancer Neg Hx   . Rectal cancer Neg Hx   . Stomach cancer Neg Hx      Social History   Socioeconomic History  . Marital  status: Married    Spouse name: Not on file  . Number of children: 3  . Years of education: Not on file  . Highest education level: Not on file  Occupational History    Employer: [employer]  Social Needs  . Financial resource strain: Not on file  . Food insecurity    Worry: Not on file    Inability: Not on file  . Transportation needs    Medical: Not on file    Non-medical: Not on file  Tobacco Use  . Smoking status: Former Smoker    Packs/day: 1.50    Years: 30.00    Pack years: 45.00    Types: Cigarettes    Quit date: 06/16/2012    Years since quitting: 6.6  . Smokeless tobacco: Never Used  Substance and Sexual Activity  . Alcohol use: No    Alcohol/week: 0.0 standard drinks  . Drug use: No  . Sexual activity: Not on file  Lifestyle  . Physical activity    Days per week: Not on file    Minutes per session: Not on file  . Stress: Not on file  Relationships  . Social Herbalist on phone: Not on file    Gets together: Not on file    Attends religious service: Not on file    Active member of club or organization: Not on file    Attends meetings of clubs or organizations: Not on file    Relationship status: Not on file  . Intimate partner violence    Fear of current or ex partner: Not on file    Emotionally abused: Not on file    Physically abused: Not on file    Forced sexual activity: Not on file  Other Topics Concern  . Not on file  Social History Narrative  . 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   No Known Allergies   Outpatient Medications Prior to Visit  Medication Sig Dispense Refill  . albuterol (PROVENTIL HFA;VENTOLIN HFA) 108 (90 Base) MCG/ACT inhaler Inhale 2 puffs into the lungs every 4 (four) hours as needed for wheezing or shortness of breath. 1 Inhaler 5  . Ferrous Sulfate (IRON CR PO) Take 1 each by mouth daily.      . hydrOXYzine (ATARAX/VISTARIL) 25 MG tablet  Take 1 tablet (25 mg total) by mouth every 4 (four) hours as needed for itching. 60 tablet 2  . ranitidine (ZANTAC) 150 MG capsule Take 150 mg by mouth as needed.     . sildenafil (REVATIO) 20 MG tablet Take up to 5 tablets by mouth 50 tablet 5  . Fluticasone-Umeclidin-Vilant (TRELEGY ELLIPTA) 100-62.5-25 MCG/INH AEPB Inhale 1 puff into the lungs daily.     No facility-administered medications prior  to visit.         Objective:   Physical Exam  Vitals:   02/07/19 0942  BP: 120/84  Pulse: 91  SpO2: 97%  Weight: 158 lb (71.7 kg)  Height: 6' (1.829 m)   Gen: Pleasant, thin, in no distress,  normal affect  ENT: No lesions,  mouth clear,  oropharynx clear, no postnasal drip  Neck: No JVD, no stridor  Lungs: No use of accessory muscles, very distant, no wheeze or crackles  Cardiovascular: RRR, heart sounds normal, no murmur or gallops, no peripheral edema  Musculoskeletal: No deformities, no cyanosis or clubbing  Neuro: alert, non focal  Skin: Warm, no lesions or rash    Assessment & Plan:  COPD (chronic obstructive pulmonary disease) ([place]) We need him to get his Medicare prescription drug coverage so that we can reestablish bronchodilator therapy.  He would benefit the most from long-acting scheduled bronchodilators.  If we are unable to arrange, he cannot get the Medicare prescription drug coverage then I will switch him over to scheduled nebulizer therapy which can be covered through Medicare part B.  We can talk about this next time.  Agree with working to get your Medicare prescription drug coverage soon as possible.  Once you have this please call us to let us know so that we can reorder your maintenance breathing medications through your pharmacy. We will give a sample of Stiolto for now.  Use 2 puffs once daily every day You would probably benefit from the flu shot.  Please let us know if you change your mind about getting this. Follow with Dr Lamonte Sakai in 3 months or sooner  if you have any problems.  Baltazar Apo, MD, PhD 02/07/2019, 10:13 AM [facility] [phone] or if no answer [phone]

## 2019-06-14 ENCOUNTER — Other Ambulatory Visit: Payer: Self-pay | Admitting: Family Medicine

## 2019-07-14 ENCOUNTER — Encounter: Payer: Self-pay | Admitting: Emergency Medicine

## 2019-07-14 ENCOUNTER — Ambulatory Visit (INDEPENDENT_AMBULATORY_CARE_PROVIDER_SITE_OTHER): Payer: Medicare Other | Admitting: Emergency Medicine

## 2019-07-14 ENCOUNTER — Other Ambulatory Visit: Payer: Self-pay

## 2019-07-14 DIAGNOSIS — J439 Emphysema, unspecified: Secondary | ICD-10-CM

## 2019-07-14 MED ORDER — ALBUTEROL SULFATE HFA 108 (90 BASE) MCG/ACT IN AERS: 2.0000 | INHALATION_SPRAY | RESPIRATORY_TRACT | 5 refills | Status: DC | PRN

## 2019-07-14 NOTE — Patient Instructions (Signed)
We will stop Stiolto for now.  We will refill your Ventolin. Use 2 puffs up to every 4 hours if needed for shortness of breath or wheezing Follow with Dr Lamonte Sakai in 6 months or sooner if you have any problems

## 2019-07-14 NOTE — Progress Notes (Signed)
Subjective:    Patient ID: Guy CARION SR., male    DOB: July 14, 1955, 64 y.o.   MRN: [id number]  Shortness of Breath Pertinent negatives include no ear pain, fever, headaches, leg swelling, rash, rhinorrhea, sore throat, vomiting or wheezing.    ROV 02/07/2019 -follow-up visit for Guy Carlson, 64 year old former smoker with severe COPD.  We have had some difficulty getting him on a stable bronchodilator regimen due to cost.  He just turned 63, is now on Medicare but did not know that he needed to get a medication supplement policy.  He has not had any drug coverage and has been trying to stretch out his Advair.  He has now run out and is not on any scheduled bronchodilators.  He reports that he is working with his wife on getting prescription drug coverage. He reports that his breathing is fairly stable, he has curtailed his activity. He is interested in trying to exercise more.   ROV 07/14/19 --64 year old man with severe obstruction by spirometry, severe COPD who follows up today.  At his last visit in October we tried getting him on Stiolto as his stable bronchodilator regimen.  He had tried Trelegy in the past but did not think it was as good as his previous Advair.  He took the samples I gave him, he has not been getting reliably since retirement, has not tried to pick it up from pharmacy - unclear how much it is going to cost.  He reports that he has not been using the stiolto, the longest he used it was for a week. He feels that his breathing does not always require it. He uses ventolin more > not every day, but a few times a week. Minimal cough, sputum. Took covid vaccine recently #1.     Review of Systems  Constitutional: Negative for fever and unexpected weight change.  HENT: Positive for congestion. Negative for dental problem, ear pain, nosebleeds, postnasal drip, rhinorrhea, sinus pressure, sneezing, sore throat and trouble swallowing.   Eyes: Negative for redness and  itching.  Respiratory: Positive for cough and shortness of breath. Negative for chest tightness and wheezing.   Cardiovascular: Negative for palpitations and leg swelling.  Gastrointestinal: Negative for nausea and vomiting.  Genitourinary: Negative for dysuria.  Musculoskeletal: Negative for joint swelling.  Skin: Negative for rash.  Neurological: Negative for headaches.  Hematological: Does not bruise/bleed easily.  Psychiatric/Behavioral: Negative for dysphoric mood. The patient is not nervous/anxious.     Past Medical History:  Diagnosis Date  . COPD (chronic obstructive pulmonary disease) ([place])    sees Dr. Baltazar Apo   . GERD (gastroesophageal reflux disease)   . Hyperlipidemia      Family History  Problem Relation Age of Onset  . Diabetes Father   . Kidney disease Father   . Diabetes Mother   . Mental illness Sister        committed suicide  . HIV Brother        died of AIDS  . Breast cancer Maternal Aunt        x2  . Ovarian cancer Sister   . Heart disease Maternal Aunt   . Heart disease Paternal Aunt   . Colon cancer Neg Hx   . Esophageal cancer Neg Hx   . Rectal cancer Neg Hx   . Stomach cancer Neg Hx      Social History   Socioeconomic History  . Marital status: Married    Spouse name: Not on file  .  Number of children: 3  . Years of education: Not on file  . Highest education level: Not on file  Occupational History    Employer: [employer]  Tobacco Use  . Smoking status: Former Smoker    Packs/day: 1.50    Years: 30.00    Pack years: 45.00    Types: Cigarettes    Quit date: 06/16/2012    Years since quitting: 7.0  . Smokeless tobacco: Never Used  Substance and Sexual Activity  . Alcohol use: No    Alcohol/week: 0.0 standard drinks  . Drug use: No  . Sexual activity: Not on file  Other Topics Concern  . Not on file  Social History Narrative  . Not on file   Social Determinants of Health   Financial Resource Strain:   .  Difficulty of Paying Living Expenses:   Food Insecurity:   . Worried About Charity fundraiser in the Last Year:   . Arboriculturist in the Last Year:   Transportation Needs:   . Film/video editor (Medical):   Marland Kitchen Lack of Transportation (Non-Medical):   Physical Activity:   . Days of Exercise per Week:   . Minutes of Exercise per Session:   Stress:   . Feeling of Stress :   Social Connections:   . Frequency of Communication with Friends and Family:   . Frequency of Social Gatherings with Friends and Family:   . Attends Religious Services:   . Active Member of Clubs or Organizations:   . Attends Archivist Meetings:   Marland Kitchen Marital Status:   Intimate Partner Violence:   . Fear of Current or Ex-Partner:   . Emotionally Abused:   Marland Kitchen Physically Abused:   . 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   No Known Allergies   Outpatient Medications Prior to Visit  Medication Sig Dispense Refill  . Ferrous Sulfate (IRON CR PO) Take 1 each by mouth daily.      . sildenafil (REVATIO) 20 MG tablet TAKE UP TO 5 TABLETS BY MOUTH 50 tablet 5  . albuterol (PROVENTIL HFA;VENTOLIN HFA) 108 (90 Base) MCG/ACT inhaler Inhale 2 puffs into the lungs every 4 (four) hours as needed for wheezing or shortness of breath. (Patient not taking: Reported on 07/14/2019) 1 Inhaler 5  . ranitidine (ZANTAC) 150 MG capsule Take 150 mg by mouth as needed.     . Tiotropium Bromide-Olodaterol (STIOLTO RESPIMAT) 2.5-2.5 MCG/ACT AERS Inhale 2 puffs into the lungs daily. (Patient not taking: Reported on 07/14/2019) 8 g 0  . hydrOXYzine (ATARAX/VISTARIL) 25 MG tablet Take 1 tablet (25 mg total) by mouth every 4 (four) hours as needed for itching. 60 tablet 2   No facility-administered medications prior to visit.        Objective:   Physical Exam  Vitals:   07/14/19 0905  BP: 112/62  Pulse: 75  Temp: 98 F (36.7 C)  TempSrc: Temporal   SpO2: 97%  Weight: 162 lb 6.4 oz (73.7 kg)  Height: 6' (1.829 m)   Gen: Pleasant, thin, in no distress,  normal affect  ENT: No lesions,  mouth clear,  oropharynx clear, no postnasal drip  Neck: No JVD, no stridor  Lungs: No use of accessory muscles, very distant, no wheeze or crackles  Cardiovascular: RRR, heart sounds normal, no murmur or gallops, no peripheral edema  Musculoskeletal: No deformities, no cyanosis or clubbing  Neuro: alert, non focal  Skin: Warm, no lesions or rash     Assessment & Plan:  COPD (chronic obstructive pulmonary disease) ([place]) He is not taking Stiolto reliably, is not sure that the samples helped him very much but he only used it for a week at a time.  He is trying to weigh the cost benefit ratio.  I asked him to check into the cost at his pharmacy because he has not filled it in a long time, see how much it would cost him.  If his symptoms change or if the cost is not prohibitive then we could consider restarting.  In the meantime he would like to just continue Ventolin as needed without a maintenance bronchodilator.  We will refill this for him.  He had the first Covid vaccine and awaits the second.  Plan to follow-up in 6 months or sooner if he has problems.  Baltazar Apo, MD, PhD 07/14/2019, 9:36 AM [facility] [phone] or if no answer [phone]

## 2019-07-14 NOTE — Addendum Note (Signed)
Addended by: Robbie Louis on: 07/14/2019 09:38 AM   Modules accepted: Orders

## 2019-07-14 NOTE — Assessment & Plan Note (Signed)
He is not taking Stiolto reliably, is not sure that the samples helped him very much but he only used it for a week at a time.  He is trying to weigh the cost benefit ratio.  I asked him to check into the cost at his pharmacy because he has not filled it in a long time, see how much it would cost him.  If his symptoms change or if the cost is not prohibitive then we could consider restarting.  In the meantime he would like to just continue Ventolin as needed without a maintenance bronchodilator.  We will refill this for him.  He had the first Covid vaccine and awaits the second.  Plan to follow-up in 6 months or sooner if he has problems.

## 2019-07-18 ENCOUNTER — Encounter: Payer: Self-pay | Admitting: Family Medicine

## 2019-07-18 ENCOUNTER — Ambulatory Visit (INDEPENDENT_AMBULATORY_CARE_PROVIDER_SITE_OTHER): Payer: Medicare Other | Admitting: Family Medicine

## 2019-07-18 ENCOUNTER — Telehealth: Payer: Self-pay | Admitting: *Deleted

## 2019-07-18 ENCOUNTER — Other Ambulatory Visit: Payer: Self-pay | Admitting: *Deleted

## 2019-07-18 ENCOUNTER — Other Ambulatory Visit: Payer: Self-pay

## 2019-07-18 VITALS — BP 120/80 | HR 75 | Temp 98.3°F | Ht 70.75 in | Wt 162.0 lb

## 2019-07-18 DIAGNOSIS — N528 Other male erectile dysfunction: Secondary | ICD-10-CM

## 2019-07-18 DIAGNOSIS — J439 Emphysema, unspecified: Secondary | ICD-10-CM | POA: Diagnosis not present

## 2019-07-18 DIAGNOSIS — N138 Other obstructive and reflux uropathy: Secondary | ICD-10-CM

## 2019-07-18 DIAGNOSIS — N401 Enlarged prostate with lower urinary tract symptoms: Secondary | ICD-10-CM | POA: Diagnosis not present

## 2019-07-18 DIAGNOSIS — E785 Hyperlipidemia, unspecified: Secondary | ICD-10-CM | POA: Diagnosis not present

## 2019-07-18 DIAGNOSIS — K219 Gastro-esophageal reflux disease without esophagitis: Secondary | ICD-10-CM | POA: Diagnosis not present

## 2019-07-18 LAB — LIPID PANEL
Cholesterol: 203 mg/dL — ABNORMAL HIGH (ref 0–200)
HDL: 44.5 mg/dL (ref >39.00)
LDL Cholesterol: 141 mg/dL — ABNORMAL HIGH (ref 0–99)
NonHDL: 158.54
Total CHOL/HDL Ratio: 5
Triglycerides: 88 mg/dL (ref 0.0–149.0)
VLDL: 17.6 mg/dL (ref 0.0–40.0)

## 2019-07-18 LAB — BASIC METABOLIC PANEL
BUN: 12 mg/dL (ref 6–23)
CO2: 32 mEq/L (ref 19–32)
Calcium: 9.5 mg/dL (ref 8.4–10.5)
Chloride: 102 mEq/L (ref 96–112)
Creatinine, Ser: 0.96 mg/dL (ref 0.40–1.50)
GFR: 94.93 mL/min (ref >60.00)
Glucose, Bld: 97 mg/dL (ref 70–99)
Potassium: 3.8 mEq/L (ref 3.5–5.1)
Sodium: 138 mEq/L (ref 135–145)

## 2019-07-18 LAB — CBC WITH DIFFERENTIAL/PLATELET
Basophils Absolute: 0 10*3/uL (ref 0.0–0.1)
Basophils Relative: 0.9 % (ref 0.0–3.0)
Eosinophils Absolute: 0.2 10*3/uL (ref 0.0–0.7)
Eosinophils Relative: 5.4 % — ABNORMAL HIGH (ref 0.0–5.0)
HCT: 42.1 % (ref 39.0–52.0)
Hemoglobin: 13.8 g/dL (ref 13.0–17.0)
Lymphocytes Relative: 19.6 % (ref 12.0–46.0)
Lymphs Abs: 0.9 10*3/uL (ref 0.7–4.0)
MCHC: 32.8 g/dL (ref 30.0–36.0)
MCV: 91 fl (ref 78.0–100.0)
Monocytes Absolute: 0.3 10*3/uL (ref 0.1–1.0)
Monocytes Relative: 6.2 % (ref 3.0–12.0)
Neutro Abs: 3.1 10*3/uL (ref 1.4–7.7)
Neutrophils Relative %: 67.9 % (ref 43.0–77.0)
Platelets: 211 10*3/uL (ref 150.0–400.0)
RBC: 4.63 Mil/uL (ref 4.22–5.81)
RDW: 13.2 % (ref 11.5–15.5)
WBC: 4.6 10*3/uL (ref 4.0–10.5)

## 2019-07-18 LAB — HEPATIC FUNCTION PANEL
ALT: 12 U/L (ref 0–53)
AST: 15 U/L (ref 0–37)
Albumin: 4.2 g/dL (ref 3.5–5.2)
Alkaline Phosphatase: 60 U/L (ref 39–117)
Bilirubin, Direct: 0.2 mg/dL (ref 0.0–0.3)
Total Bilirubin: 0.9 mg/dL (ref 0.2–1.2)
Total Protein: 6.7 g/dL (ref 6.0–8.3)

## 2019-07-18 LAB — TSH: TSH: 1.18 u[IU]/mL (ref 0.35–4.50)

## 2019-07-18 LAB — PSA: PSA: 0.38 ng/mL (ref 0.10–4.00)

## 2019-07-18 MED ORDER — CIALIS 20 MG PO TABS: 20.0000 mg | ORAL_TABLET | Freq: Every day | ORAL | 11 refills | Status: DC | PRN

## 2019-07-18 MED ORDER — TADALAFIL 20 MG PO TABS: 20.0000 mg | ORAL_TABLET | Freq: Every day | ORAL | 11 refills | Status: DC | PRN

## 2019-07-18 MED ORDER — OMEPRAZOLE 40 MG PO CPDR: 40.0000 mg | DELAYED_RELEASE_CAPSULE | Freq: Every day | ORAL | 11 refills | Status: DC

## 2019-07-18 NOTE — Telephone Encounter (Signed)
Patient called office and requested that Dr. Sarajane Jews change Rx for Cialis 20 mg to Tadalafil per pharmacy suggestion.

## 2019-07-18 NOTE — Progress Notes (Signed)
Subjective:    Patient ID: Guy SIEGMAN SR., male    DOB: June 30, 1955, 64 y.o.   MRN: [id number]  HPI Here to follow up on issues. He feels great and has no concerns. He sees Dr. Lamonte Sakai every 6 months for the COPD. He is now retired. He has had the first Covid vaccine. He wants to change to name brand Cialis. He wants to try Omeprazole for GERD, now that Zantac is not available.    Review of Systems  Constitutional: Negative.   HENT: Negative.   Eyes: Negative.   Respiratory: Negative.   Cardiovascular: Negative.   Gastrointestinal: Negative.   Genitourinary: Negative.   Musculoskeletal: Negative.   Skin: Negative.   Neurological: Negative.   Psychiatric/Behavioral: Negative.        Objective:   Physical Exam Constitutional:      General: He is not in acute distress.    Appearance: He is well-developed. He is not diaphoretic.  HENT:     Head: Normocephalic and atraumatic.     Right Ear: External ear normal.     Left Ear: External ear normal.     Nose: Nose normal.     Mouth/Throat:     Pharynx: No oropharyngeal exudate.  Eyes:     General: No scleral icterus.       Right eye: No discharge.        Left eye: No discharge.     Conjunctiva/sclera: Conjunctivae normal.     Pupils: Pupils are equal, round, and reactive to light.  Neck:     Thyroid: No thyromegaly.     Vascular: No JVD.     Trachea: No tracheal deviation.  Cardiovascular:     Rate and Rhythm: Normal rate and regular rhythm.     Heart sounds: Normal heart sounds. No murmur. No friction rub. No gallop.   Pulmonary:     Effort: Pulmonary effort is normal. No respiratory distress.     Breath sounds: Normal breath sounds. No wheezing or rales.  Chest:     Chest wall: No tenderness.  Abdominal:     General: Bowel sounds are normal. There is no distension.     Palpations: Abdomen is soft. There is no mass.     Tenderness: There is no abdominal tenderness. There is no guarding or rebound.    Genitourinary:    Penis: Normal. No tenderness.      Testes: Normal.     Prostate: Normal.     Rectum: Normal. Guaiac result negative.  Musculoskeletal:        General: No tenderness. Normal range of motion.     Cervical back: Neck supple.  Lymphadenopathy:     Cervical: No cervical adenopathy.  Skin:    General: Skin is warm and dry.     Coloration: Skin is not pale.     Findings: No erythema or rash.  Neurological:     Mental Status: He is alert and oriented to person, place, and time.     Cranial Nerves: No cranial nerve deficit.     Motor: No abnormal muscle tone.     Coordination: Coordination normal.     Deep Tendon Reflexes: Reflexes are normal and symmetric. Reflexes normal.  Psychiatric:        Behavior: Behavior normal.        Thought Content: Thought content normal.        Judgment: Judgment normal.           Assessment & Plan:  His GERD and ED are stable, and we will change the medications as above. His COPD is stable. We discussed exercise. He will get fasting labs today to include lipids, etc.  [name], MD

## 2019-07-18 NOTE — Telephone Encounter (Signed)
Rx changed as requested

## 2019-07-18 NOTE — Telephone Encounter (Signed)
Okay, call the pharmacy and have them change this from name brand to generic Tadalifil (same directions)

## 2019-09-21 ENCOUNTER — Telehealth: Payer: Self-pay | Admitting: *Deleted

## 2019-09-21 ENCOUNTER — Other Ambulatory Visit: Payer: Self-pay

## 2019-09-21 ENCOUNTER — Ambulatory Visit: Payer: Medicare Other | Admitting: Family Medicine

## 2019-09-21 NOTE — Telephone Encounter (Signed)
Patient's wife spoke with Herbert Pun, RN with Nurse Triage. Patient wife reports husbands lips have turned black and no fever. Triage nurse advised patient wife to go to ED if PCP office can't see patient with in one hour. Patient was scheduled with another PCP for 4:30 PM today.   Clinic RN consulted with Dr. Volanda Napoleon who patient was scheduled to see. Per Dr. Volanda Napoleon patient needs to go to ED. Clinic RN informed wife that patient needs to go to ED. Wiife verbalized understanding and reports that husband probably will not go and they will try to schedule an appointment with Dr. Sarajane Jews next week.

## 2020-02-02 DIAGNOSIS — Z20822 Contact with and (suspected) exposure to covid-19: Secondary | ICD-10-CM | POA: Diagnosis not present

## 2020-03-11 IMAGING — DX DG CHEST 2V
2 series · 2 of 2 positions shown · non-contrast
Comparison: Prior radiograph from 06/06/2012.

CLINICAL DATA: Initial evaluation for chronic intermittent cough,
congestion, shortness of breath.

EXAM:
CHEST - 2 VIEW

[chest pa]
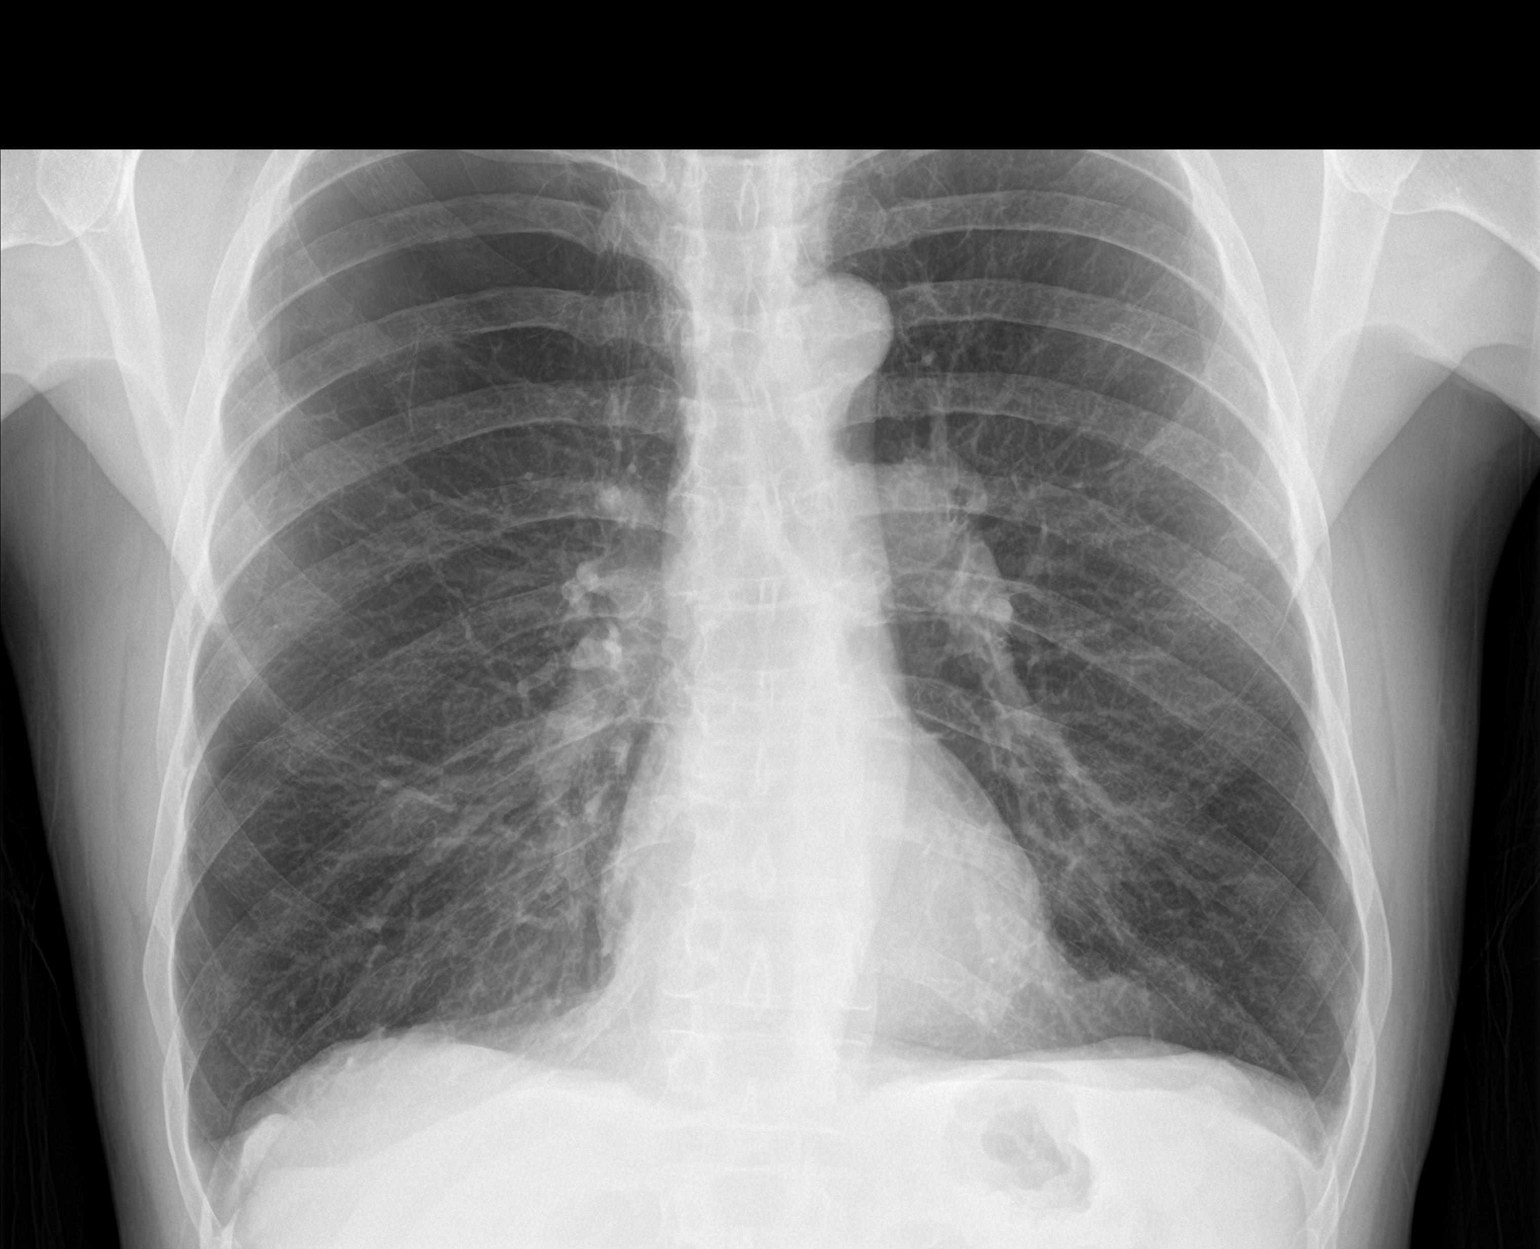

[chest lat]
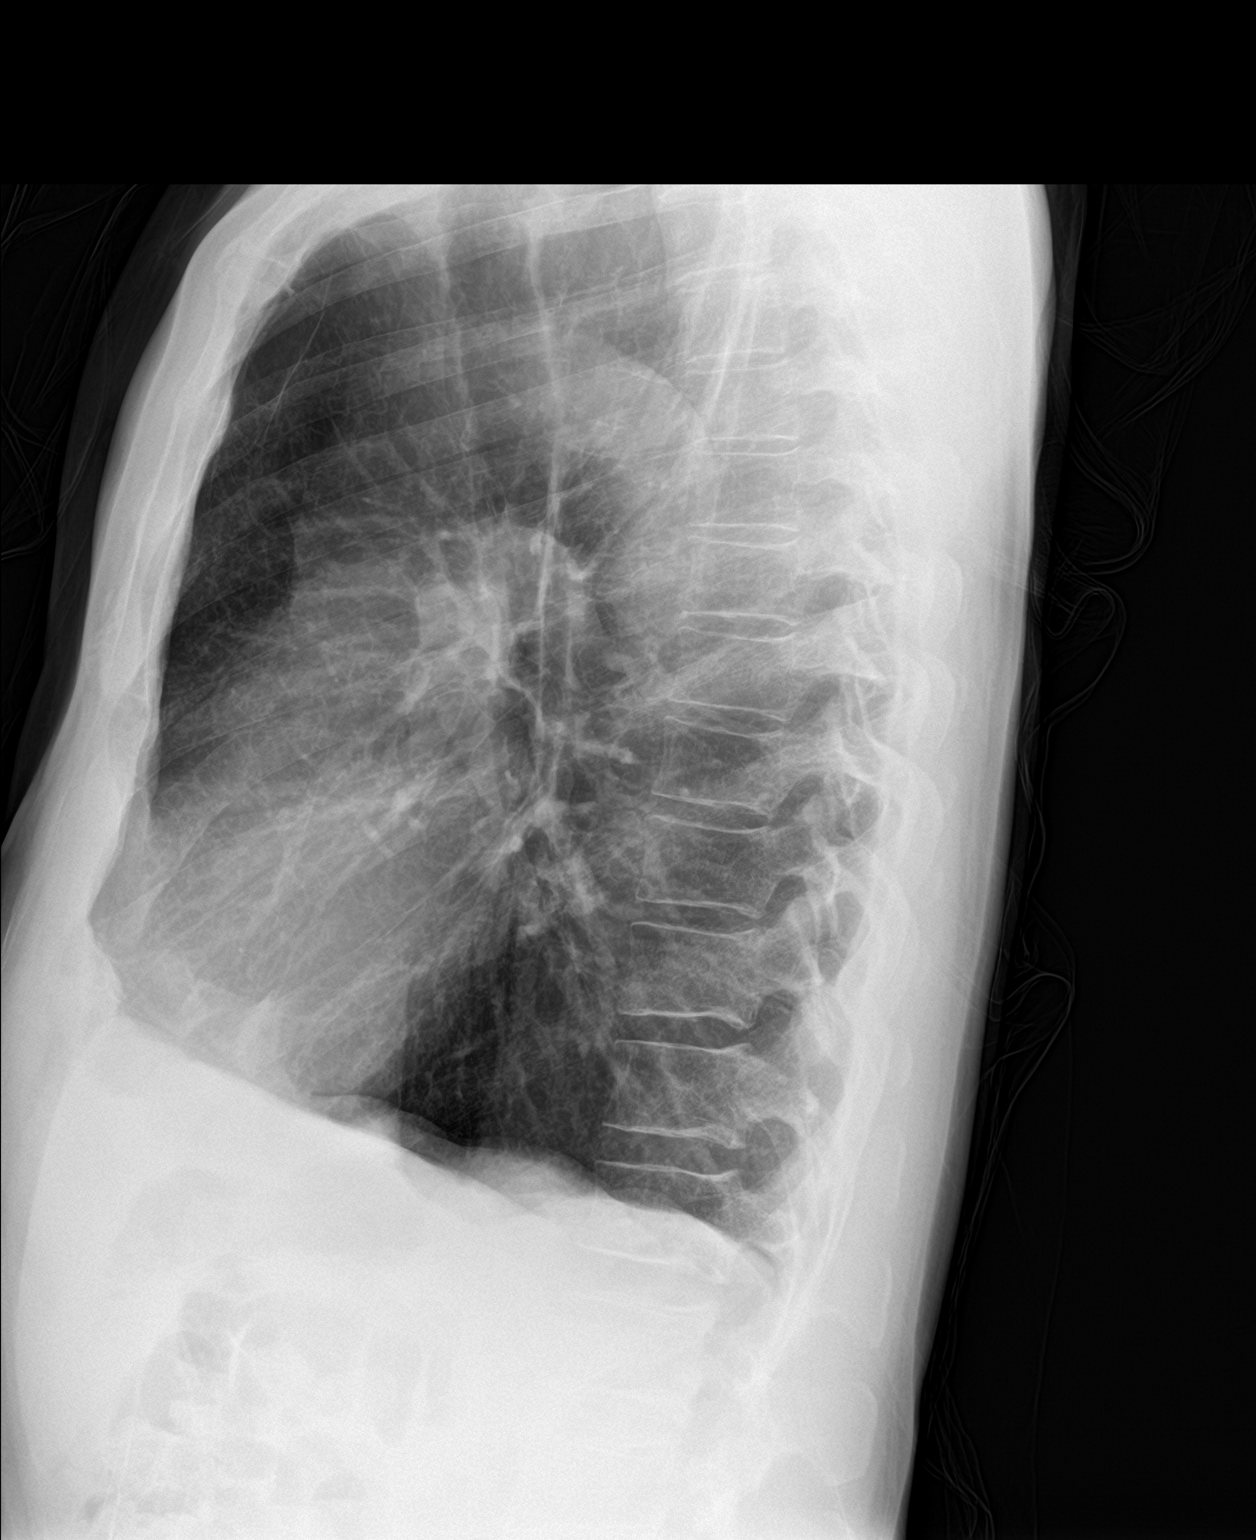

[2 of 2 positions shown; findings below may reference images not displayed]

FINDINGS: Cardiac and mediastinal silhouettes are stable in size and contour,
and remain within normal limits. Mild aortic atherosclerosis.

Lungs are hyperinflated with advanced changes of COPD. Prominent
bullous emphysema at the right lung apex. No focal infiltrates. No
pulmonary edema or pleural effusion. No pneumothorax.

No acute osseous abnormality.
IMPRESSION: 1. Hyperinflation with advanced underlying COPD. No active
cardiopulmonary disease.
2. Aortic atherosclerosis.

## 2020-04-11 ENCOUNTER — Telehealth: Payer: Self-pay | Admitting: Emergency Medicine

## 2020-04-11 MED ORDER — STIOLTO RESPIMAT 2.5-2.5 MCG/ACT IN AERS: 2.0000 | INHALATION_SPRAY | Freq: Every day | RESPIRATORY_TRACT | 2 refills | Status: DC

## 2020-04-11 NOTE — Telephone Encounter (Signed)
Called patient. Last note he was not taking due to cost. Can discuss in detail at ov with Dr. Lamonte Sakai  Next month .  Will refill x2 . Has ov with Dr. Lamonte Sakai  In January .  Left message on machine

## 2020-04-12 DIAGNOSIS — Z20822 Contact with and (suspected) exposure to covid-19: Secondary | ICD-10-CM | POA: Diagnosis not present

## 2020-05-08 ENCOUNTER — Telehealth: Payer: Self-pay | Admitting: Family Medicine

## 2020-05-08 NOTE — Telephone Encounter (Signed)
Left message for patient to call back and schedule Medicare Annual Wellness Visit (AWV) either virtually or in office.   Last AWV no information please schedule at anytime with [facility] Nurse Health Advisor 1 or 2   This should be a 45 minute visit. 

## 2020-05-09 ENCOUNTER — Other Ambulatory Visit: Payer: Self-pay

## 2020-05-09 ENCOUNTER — Ambulatory Visit (INDEPENDENT_AMBULATORY_CARE_PROVIDER_SITE_OTHER): Payer: Medicare Other | Admitting: Family Medicine

## 2020-05-09 ENCOUNTER — Encounter: Payer: Self-pay | Admitting: Family Medicine

## 2020-05-09 VITALS — BP 112/60 | HR 90 | Ht 70.75 in | Wt 154.0 lb

## 2020-05-09 DIAGNOSIS — M79672 Pain in left foot: Secondary | ICD-10-CM | POA: Diagnosis not present

## 2020-05-09 NOTE — Progress Notes (Signed)
   Subjective:    Patient ID: Guy MULVEHILL SR., male    DOB: 27-May-1955, 65 y.o.   MRN: [id number]  HPI Here for one month of pain in the left foot, along the lateral edge of the foot. No swelling or redness. No hx of trauma.    Review of Systems  Constitutional: Negative.   Respiratory: Negative.   Cardiovascular: Negative.        Objective:   Physical Exam Constitutional:      Appearance: Normal appearance.     Comments: Walks normally   Cardiovascular:     Rate and Rhythm: Normal rate and regular rhythm.     Pulses: Normal pulses.     Heart sounds: Normal heart sounds.  Pulmonary:     Effort: Pulmonary effort is normal.     Breath sounds: Normal breath sounds.  Musculoskeletal:     Comments: There is a bunion on the lateral edge of the left foot. This is mildly tender. No swelling or erythema or warmth   Neurological:     Mental Status: He is alert.           Assessment & Plan:  Bunion, refer to Podiatry . [name], MD

## 2020-05-17 ENCOUNTER — Other Ambulatory Visit: Payer: Self-pay

## 2020-05-17 ENCOUNTER — Encounter: Payer: Self-pay | Admitting: Emergency Medicine

## 2020-05-17 ENCOUNTER — Ambulatory Visit (INDEPENDENT_AMBULATORY_CARE_PROVIDER_SITE_OTHER): Payer: Medicare Other | Admitting: Emergency Medicine

## 2020-05-17 DIAGNOSIS — J439 Emphysema, unspecified: Secondary | ICD-10-CM | POA: Diagnosis not present

## 2020-05-17 MED ORDER — STIOLTO RESPIMAT 2.5-2.5 MCG/ACT IN AERS: 2.0000 | INHALATION_SPRAY | Freq: Every day | RESPIRATORY_TRACT | 0 refills | Status: DC

## 2020-05-17 NOTE — Assessment & Plan Note (Signed)
Doing well.  Good functional capacity.  He is having an easier time getting his Stiolto, compliance is better.  He is back to work 2 days a week, out of retirement.  No flares, no exacerbations, no hospitalizations.  Plan continue same regimen, follow-up in 12 months or as needed  Please continue Stiolto 2 puffs once daily as you have been taking it. Keep your albuterol (Ventolin) available to use 2 puffs up to every 4 hours if needed for shortness of breath, chest tightness, wheezing. COVID-19 vaccine is up-to-date Consider getting the flu shot Follow with Dr. Lamonte Sakai in 12 months or sooner if you have any problems.

## 2020-05-17 NOTE — Addendum Note (Signed)
Addended by: Gavin Potters R on: 05/17/2020 10:44 AM   Modules accepted: Orders

## 2020-05-17 NOTE — Patient Instructions (Signed)
Please continue Stiolto 2 puffs once daily as you have been taking it. Keep your albuterol (Ventolin) available to use 2 puffs up to every 4 hours if needed for shortness of breath, chest tightness, wheezing. COVID-19 vaccine is up-to-date Consider getting the flu shot Follow with Dr. Lamonte Sakai in 12 months or sooner if you have any problems.

## 2020-05-17 NOTE — Progress Notes (Signed)
Subjective:    Patient ID: Guy MCINTURFF SR., male    DOB: November 10, 1955, 65 y.o.   MRN: [id number]  Shortness of Breath Pertinent negatives include no ear pain, fever, headaches, leg swelling, rash, rhinorrhea, sore throat, vomiting or wheezing.    ROV 07/14/19 --65 year old man with severe obstruction by spirometry, severe COPD who follows up today.  At his last visit in October we tried getting him on Stiolto as his stable bronchodilator regimen.  He had tried Trelegy in the past but did not think it was as good as his previous Advair.  He took the samples I gave him, he has not been getting reliably since retirement, has not tried to pick it up from pharmacy - unclear how much it is going to cost.  He reports that he has not been using the stiolto, the longest he used it was for a week. He feels that his breathing does not always require it. He uses ventolin more > not every day, but a few times a week. Minimal cough, sputum. Took covid vaccine recently #1.   ROV 05/17/20 --follow-up visit for 65 year old gentleman with a history of severe COPD.  We have been having difficulty getting him on a stable bronchodilator regimen due to cost.  He has most recently been on Stiolto, has Ventolin which he uses as needed. He reports that he has been doing well. 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 He is able to walk, exert, does better in the cold months. Uses albuterol about once a day. He has some nasal congestion especially with dust exposure, no real cough. He can get SOB when he "moves too fast". Reliable with his Stiolto at this time.  COVID vaccine up to date, does not want the flu shot    Review of Systems  Constitutional: Negative for fever and unexpected weight change.  HENT: Positive for congestion. Negative for dental problem, ear pain, nosebleeds, postnasal drip, rhinorrhea, sinus pressure, sneezing, sore throat and trouble swallowing.    Eyes: Negative for redness and itching.  Respiratory: Positive for shortness of breath. Negative for cough, chest tightness and wheezing.   Cardiovascular: Negative for palpitations and leg swelling.  Gastrointestinal: Negative for nausea and vomiting.  Genitourinary: Negative for dysuria.  Musculoskeletal: Negative for joint swelling.  Skin: Negative for rash.  Neurological: Negative for headaches.  Hematological: Does not bruise/bleed easily.  Psychiatric/Behavioral: Negative for dysphoric mood. The patient is not nervous/anxious.         Objective:   Physical Exam  Vitals:   05/17/20 0925  BP: 132/70  Pulse: 99  Temp: (!) 97.4 F (36.3 C)  SpO2: 98%  Weight: 155 lb 9.6 oz (70.6 kg)  Height: 5\' 11"  (1.803 m)   Gen: Pleasant, thin, in no distress,  normal affect  ENT: No lesions,  mouth clear,  oropharynx clear, no postnasal drip  Neck: No JVD, no stridor  Lungs: No use of accessory muscles, very distant, no wheeze or crackles  Cardiovascular: RRR, heart sounds normal, no murmur or gallops, no peripheral edema  Musculoskeletal: No deformities, no cyanosis or clubbing  Neuro: alert, non focal  Skin: Warm, no lesions or rash     Assessment & Plan:  COPD (chronic obstructive pulmonary disease) ([place]) Doing well.  Good functional capacity.  He is having an easier time getting his Stiolto, compliance is better.  He is back to work 2 days a week,  out of retirement.  No flares, no exacerbations, no hospitalizations.  Plan continue same regimen, follow-up in 12 months or as needed  Please continue Stiolto 2 puffs once daily as you have been taking it. Keep your albuterol (Ventolin) available to use 2 puffs up to every 4 hours if needed for shortness of breath, chest tightness, wheezing. COVID-19 vaccine is up-to-date Consider getting the flu shot Follow with Dr. Lamonte Sakai in 12 months or sooner if you have any problems.  Baltazar Apo, MD, PhD 05/17/2020, 10:02 AM [place]  [facility] [phone] or if no answer [phone]

## 2020-05-31 ENCOUNTER — Ambulatory Visit (INDEPENDENT_AMBULATORY_CARE_PROVIDER_SITE_OTHER): Payer: Medicare Other

## 2020-05-31 ENCOUNTER — Ambulatory Visit: Payer: Medicare Other | Admitting: Podiatry

## 2020-05-31 ENCOUNTER — Other Ambulatory Visit: Payer: Self-pay | Admitting: Podiatry

## 2020-05-31 ENCOUNTER — Other Ambulatory Visit: Payer: Self-pay

## 2020-05-31 DIAGNOSIS — M21622 Bunionette of left foot: Secondary | ICD-10-CM | POA: Diagnosis not present

## 2020-05-31 DIAGNOSIS — M205X2 Other deformities of toe(s) (acquired), left foot: Secondary | ICD-10-CM | POA: Diagnosis not present

## 2020-05-31 DIAGNOSIS — M7752 Other enthesopathy of left foot: Secondary | ICD-10-CM

## 2020-05-31 DIAGNOSIS — M79672 Pain in left foot: Secondary | ICD-10-CM

## 2020-05-31 MED ORDER — BETAMETHASONE SOD PHOS & ACET 6 (3-3) MG/ML IJ SUSP
3.0000 mg | Freq: Once | INTRAMUSCULAR | Status: AC
  Administered 2020-05-31: 3 mg

## 2020-05-31 NOTE — Progress Notes (Signed)
  Subjective:  Patient ID: Guy Carlson., male    DOB: 06-08-1955,  MRN: [id number]  Chief Complaint  Patient presents with  . Toe Pain    Left 5th digit pain 2-3 month duration, no known injuries.    65 y.o. male presents with the above complaint. History confirmed with patient. States that it often hurts at night. Worse with shoegear.  Objective:  Physical Exam: warm, good capillary refill, no trophic changes or ulcerative lesions, normal DP and PT pulses and normal sensory exam. Left Foot: POP left 5th MPJ. Adductovarus 5th toe without POP.    No images are attached to the encounter.  Radiographs: X-ray of the left foot: no fracture, dislocation, swelling or degenerative changes noted and tailor's bunion noted Assessment:   1. Tailor's bunionette, left   2. Acquired adductovarus rotation of toe, left   3. Capsulitis of metatarsophalangeal (MTP) joint of left foot    Plan:  Patient was evaluated and treated and all questions answered.  Tailor's bunion -Educated on etiology -XR reviewed with patient -Discussed padding and proper shoegear -Injection delivered to the painful joint  Procedure: Joint Injection Location: Left 5th MP joint Skin Prep: Alcohol. Injectate: 0.5 cc 1% lidocaine plain, 0.5 cc betamethasone acetate-betamethasone sodium phosphate Disposition: Patient tolerated procedure well. Injection site dressed with a band-aid.  Return if symptoms worsen or fail to improve.

## 2020-07-10 ENCOUNTER — Telehealth: Payer: Self-pay | Admitting: Family Medicine

## 2020-07-10 NOTE — Telephone Encounter (Signed)
Left message for patient to call back and schedule Medicare Annual Wellness Visit (AWV) either virtually or in office. No detailed message left   AWVI  please schedule at anytime with [facility] Nurse Health Advisor 1 or 2   This should be a 45 minute visit. 

## 2020-07-28 ENCOUNTER — Other Ambulatory Visit: Payer: Self-pay | Admitting: Family Medicine

## 2020-09-10 ENCOUNTER — Other Ambulatory Visit: Payer: Self-pay | Admitting: Family Medicine

## 2020-10-01 ENCOUNTER — Telehealth: Payer: Self-pay | Admitting: Family Medicine

## 2020-10-01 NOTE — Telephone Encounter (Signed)
Left message for patient to call back and schedule Medicare Annual Wellness Visit (AWV) either virtually or in office.   awvi 10/26/19 per [facility]   please schedule at anytime with [facility] Nurse Health Advisor 1 or 2   This should be a 45 minute visit.

## 2020-11-08 ENCOUNTER — Telehealth: Payer: Self-pay | Admitting: Family Medicine

## 2020-11-08 NOTE — Telephone Encounter (Signed)
Left message for patient to call back and schedule Medicare Annual Wellness Visit (AWV) either virtually or in office.   awvi 10/26/19 per [facility]   please schedule at anytime with [facility] Nurse Health Advisor 1 or 2   This should be a 45 minute visit.

## 2020-11-10 ENCOUNTER — Other Ambulatory Visit: Payer: Self-pay | Admitting: Emergency Medicine

## 2020-12-09 ENCOUNTER — Telehealth: Payer: Self-pay

## 2020-12-09 ENCOUNTER — Telehealth: Payer: Self-pay | Admitting: Family Medicine

## 2020-12-09 NOTE — Telephone Encounter (Signed)
Left message for patient to call back and schedule Medicare Annual Wellness Visit (AWV) either virtually or in office.  Left both  my jabber number [phone] and office number    awvi 10/26/19 per [facility]  please schedule at anytime with [facility] Nurse Health Advisor 1 or 2   This should be a 45 minute visit.

## 2020-12-09 NOTE — Telephone Encounter (Signed)
Patient returned call informed pt reason for call was to schedule AWV pt stated he would call back to schedule appt

## 2021-01-03 ENCOUNTER — Ambulatory Visit (INDEPENDENT_AMBULATORY_CARE_PROVIDER_SITE_OTHER): Payer: Medicare Other | Admitting: Family Medicine

## 2021-01-03 ENCOUNTER — Other Ambulatory Visit: Payer: Self-pay

## 2021-01-03 VITALS — BP 120/70 | HR 93 | Temp 98.1°F | Wt 151.6 lb

## 2021-01-03 DIAGNOSIS — J019 Acute sinusitis, unspecified: Secondary | ICD-10-CM

## 2021-01-03 MED ORDER — AMOXICILLIN-POT CLAVULANATE 875-125 MG PO TABS: 1.0000 | ORAL_TABLET | Freq: Two times a day (BID) | ORAL | 0 refills | Status: DC

## 2021-01-03 NOTE — Progress Notes (Signed)
Established Patient Office Visit  Subjective:  Patient ID: Guy Adams., male    DOB: Mar 26, 1956  Age: 65 y.o. MRN: [id number]  CC:  Chief Complaint  Patient presents with   Headache    X 3 days, pain radiates up the back of the neck and into the head, pain comes and goes, worse during the night    HPI Guy GIL Sr. presents for several day history of intermittent headaches and some sinus congestion and frontal sinus pressure.  Some radiation toward the occiput.  He states he has had some colored nasal discharge.  No fever.  No cough.  He is taken some Tylenol without relief.  Has noted some increased malaise past few days.  He has history of known COPD.  He takes Stiolto Respimat for that.  Also has rescue inhaler.  Still works part-time as a Freight forwarder for a [employer] and states he works in a fairly dusty environment.  Past Medical History:  Diagnosis Date   COPD (chronic obstructive pulmonary disease) ([place])    sees Dr. Baltazar Apo    GERD (gastroesophageal reflux disease)    Hyperlipidemia     Past Surgical History:  Procedure Laterality Date   COLONOSCOPY  02/09/2018   per Dr. Fuller Plan, benign polyps, repeat in 5 yrs (previous hx of adenomas)     Family History  Problem Relation Age of Onset   Diabetes Father    Kidney disease Father    Diabetes Mother    Mental illness Sister        committed suicide   HIV Brother        died of AIDS   Breast cancer Maternal Aunt        x2   Ovarian cancer Sister    Heart disease Maternal Aunt    Heart disease Paternal Aunt    Colon cancer Neg Hx    Esophageal cancer Neg Hx    Rectal cancer Neg Hx    Stomach cancer Neg Hx     Social History   Socioeconomic History   Marital status: Married    Spouse name: Not on file   Number of children: 3   Years of education: Not on file   Highest education level: Not on file  Occupational History    Employer: [employer]  Tobacco Use    Smoking status: Former    Packs/day: 1.50    Years: 30.00    Pack years: 45.00    Types: Cigarettes    Quit date: 06/16/2012    Years since quitting: 8.5   Smokeless tobacco: Never  Vaping Use   Vaping Use: Never used  Substance and Sexual Activity   Alcohol use: No    Alcohol/week: 0.0 standard drinks   Drug use: No   Sexual activity: Not on file  Other Topics Concern   Not on file  Social History Narrative   Not on file   Social Determinants of Health   Financial Resource Strain: Not on file  Food Insecurity: Not on file  Transportation Needs: Not on file  Physical Activity: Not on file  Stress: Not on file  Social Connections: Not on file  Intimate Partner Violence: Not on file    Outpatient Medications Prior to Visit  Medication Sig Dispense Refill   albuterol (VENTOLIN HFA) 108 (90 Base) MCG/ACT inhaler INHALE 2 PUFFS INTO THE LUNGS EVERY 4 (FOUR) HOURS AS NEEDED FOR WHEEZING OR SHORTNESS OF BREATH. 18 each 5  Ferrous Sulfate (IRON CR PO) Take 1 each by mouth daily.     ID NOW COVID-19 KIT TEST AS DIRECTED TODAY     omeprazole (PRILOSEC) 40 MG capsule Take 1 capsule (40 mg total) by mouth daily. 30 capsule 11   tadalafil (CIALIS) 20 MG tablet TAKE 1 TABLET BY MOUTH EVERY DAY AS NEEDED FOR ERECTILE DYSFUNCTION 10 tablet 5   Tiotropium Bromide-Olodaterol (STIOLTO RESPIMAT) 2.5-2.5 MCG/ACT AERS Inhale 2 puffs into the lungs daily. 8 g 2   Tiotropium Bromide-Olodaterol (STIOLTO RESPIMAT) 2.5-2.5 MCG/ACT AERS Inhale 2 puffs into the lungs daily. 4 g 0   No facility-administered medications prior to visit.    No Known Allergies  ROS Review of Systems  Constitutional:  Positive for fatigue. Negative for chills and fever.  HENT:  Positive for sinus pressure and sinus pain.   Respiratory:  Positive for cough. Negative for shortness of breath.   Neurological:  Positive for headaches.     Objective:    Physical Exam Vitals reviewed.  Constitutional:       Appearance: He is well-developed.  Cardiovascular:     Rate and Rhythm: Normal rate.  Pulmonary:     Effort: Pulmonary effort is normal.     Breath sounds: No wheezing or rales.  Musculoskeletal:     Cervical back: Neck supple.  Lymphadenopathy:     Cervical: No cervical adenopathy.  Neurological:     Mental Status: He is alert.    BP 120/70 (BP Location: Left Arm, Patient Position: Sitting, Cuff Size: Normal)   Pulse 93   Temp 98.1 F (36.7 C) (Oral)   Wt 151 lb 9.6 oz (68.8 kg)   SpO2 98%   BMI 21.14 kg/m  Wt Readings from Last 3 Encounters:  01/03/21 151 lb 9.6 oz (68.8 kg)  05/17/20 155 lb 9.6 oz (70.6 kg)  05/09/20 154 lb (69.9 kg)     Health Maintenance Due  Topic Date Due   Hepatitis C Screening  Never done   TETANUS/TDAP  Never done   Zoster Vaccines- Shingrix (1 of 2) Never done   PNA vac Low Risk Adult (1 of 2 - PCV13) Never done   COVID-19 Vaccine (4 - Booster) 06/14/2020   INFLUENZA VACCINE  11/25/2020    There are no preventive care reminders to display for this patient.  Lab Results  Component Value Date   TSH 1.18 07/18/2019   Lab Results  Component Value Date   WBC 4.6 07/18/2019   HGB 13.8 07/18/2019   HCT 42.1 07/18/2019   MCV 91.0 07/18/2019   PLT 211.0 07/18/2019   Lab Results  Component Value Date   NA 138 07/18/2019   K 3.8 07/18/2019   CO2 32 07/18/2019   GLUCOSE 97 07/18/2019   BUN 12 07/18/2019   CREATININE 0.96 07/18/2019   BILITOT 0.9 07/18/2019   ALKPHOS 60 07/18/2019   AST 15 07/18/2019   ALT 12 07/18/2019   PROT 6.7 07/18/2019   ALBUMIN 4.2 07/18/2019   CALCIUM 9.5 07/18/2019   GFR 94.93 07/18/2019   Lab Results  Component Value Date   CHOL 203 (H) 07/18/2019   Lab Results  Component Value Date   HDL 44.50 07/18/2019   Lab Results  Component Value Date   LDLCALC 141 (H) 07/18/2019   Lab Results  Component Value Date   TRIG 88.0 07/18/2019   Lab Results  Component Value Date   CHOLHDL 5 07/18/2019    No results found for: HGBA1C  Assessment & Plan:   Problem List Items Addressed This Visit   None Visit Diagnoses     Acute non-recurrent sinusitis, unspecified location    -  Primary   Relevant Medications   amoxicillin-clavulanate (AUGMENTIN) 875-125 MG tablet     Several day history of intermittent facial pain, malaise, frontal sinus headaches.  Question acute sinusitis.  Start Augmentin as above.  Plenty fluids and rest.  Touch base if symptoms not improving by next week and sooner as needed.  Meds ordered this encounter  Medications   amoxicillin-clavulanate (AUGMENTIN) 875-125 MG tablet    Sig: Take 1 tablet by mouth 2 (two) times daily.    Dispense:  20 tablet    Refill:  0    Follow-up: No follow-ups on file.    Carolann Littler, MD

## 2021-02-28 ENCOUNTER — Other Ambulatory Visit: Payer: Self-pay | Admitting: Adult Health

## 2021-03-03 DIAGNOSIS — Z20822 Contact with and (suspected) exposure to covid-19: Secondary | ICD-10-CM | POA: Diagnosis not present

## 2021-03-04 DIAGNOSIS — R5383 Other fatigue: Secondary | ICD-10-CM | POA: Diagnosis not present

## 2021-03-04 DIAGNOSIS — R051 Acute cough: Secondary | ICD-10-CM | POA: Diagnosis not present

## 2021-03-04 DIAGNOSIS — J209 Acute bronchitis, unspecified: Secondary | ICD-10-CM | POA: Diagnosis not present

## 2021-03-10 ENCOUNTER — Telehealth: Payer: Self-pay | Admitting: Emergency Medicine

## 2021-03-10 MED ORDER — AZITHROMYCIN 250 MG PO TABS: ORAL_TABLET | ORAL | 0 refills | Status: AC

## 2021-03-10 MED ORDER — PREDNISONE 10 MG PO TABS: ORAL_TABLET | ORAL | 0 refills | Status: AC

## 2021-03-10 NOTE — Telephone Encounter (Signed)
I have called the patient back and gave her the recommendations of Dr. Lamonte Sakai and she did not have any questions. She will pick up the medication at the pharmacy.

## 2021-03-10 NOTE — Telephone Encounter (Signed)
I called the patient wife back and she reports that he has had a non productive cough for a week and she reports it was green the week prior. [facility] and was told he did not have flu or covid and was given tessalon pearls and they are not helping. Pharmacy on file is [facility].   He is taking the Stiolo on a daily basis and using the Albuterol as needed. She wants to know if you can recommend anything else or does he need an OV with a provider? Please advise.

## 2021-03-10 NOTE — Telephone Encounter (Signed)
I can treat him for possible AE-COPD in setting URI. If he is no better after this then we should get him in to be seen  Azithro > Z pack Pred > Take 40mg  daily for 3 days, then 30mg  daily for 3 days, then 20mg  daily for 3 days, then 10mg  daily for 3 days, then stop

## 2021-03-13 ENCOUNTER — Telehealth: Payer: Self-pay | Admitting: Family Medicine

## 2021-03-13 NOTE — Telephone Encounter (Signed)
Left message for patient to call back and schedule Medicare Annual Wellness Visit (AWV) either virtually or in office. Left  my jabber number [phone]    awvi 10/26/19 per [facility]  ; please schedule at anytime with [facility] Nurse Health Advisor 1 or 2   This should be a 45 minute visit.  

## 2021-04-08 DIAGNOSIS — Z20822 Contact with and (suspected) exposure to covid-19: Secondary | ICD-10-CM | POA: Diagnosis not present

## 2021-04-10 ENCOUNTER — Telehealth (INDEPENDENT_AMBULATORY_CARE_PROVIDER_SITE_OTHER): Payer: Medicare Other | Admitting: Family Medicine

## 2021-04-10 ENCOUNTER — Encounter: Payer: Self-pay | Admitting: Family Medicine

## 2021-04-10 ENCOUNTER — Other Ambulatory Visit: Payer: Self-pay

## 2021-04-10 DIAGNOSIS — U071 COVID-19: Secondary | ICD-10-CM | POA: Diagnosis not present

## 2021-04-10 MED ORDER — PROMETHAZINE-DM 6.25-15 MG/5ML PO SYRP: 5.0000 mL | ORAL_SOLUTION | Freq: Three times a day (TID) | ORAL | 0 refills | Status: DC | PRN

## 2021-04-10 NOTE — Progress Notes (Signed)
Virtual Visit via Video Note  I connected with [facility]  on 04/10/21 at  3:20 PM EST by a video enabled telemedicine application and verified that I am speaking with the correct person using two identifiers.  Location patient: home, [place] Location provider:work or home office Persons participating in the virtual visit: patient, provider  I discussed the limitations of evaluation and management by telemedicine and the availability of in person appointments. The patient expressed understanding and agreed to proceed.   HPI:  Acute telemedicine visit for Covid19: -Onset: about 6-7 days ago tested positive for covid -Symptoms include: cough, congestion -Denies: CP, SOB above baseline, fevers, NVD -inability eat/drink/get out of bed -Has tried: using his albuterol - but has not needed it much -Pertinent past medical history:see below -Pertinent medication allergies: No Known Allergies -COVID-19 vaccine status:  Immunization History  Administered Date(s) Administered   Influenza,inj,Quad PF,6+ Mos 07/01/2018   PFIZER(Purple Top)SARS-COV-2 Vaccination 06/26/2019, 07/12/2019, 02/12/2020    ROS: See pertinent positives and negatives per HPI.  Past Medical History:  Diagnosis Date   COPD (chronic obstructive pulmonary disease) ([place])    sees Dr. Baltazar Apo    GERD (gastroesophageal reflux disease)    Hyperlipidemia     Past Surgical History:  Procedure Laterality Date   COLONOSCOPY  02/09/2018   per Dr. Fuller Plan, benign polyps, repeat in 5 yrs (previous hx of adenomas)      Current Outpatient Medications:    ADVAIR HFA 230-21 MCG/ACT inhaler, Inhale 2 puffs into the lungs 2 (two) times daily., Disp: , Rfl:    albuterol (VENTOLIN HFA) 108 (90 Base) MCG/ACT inhaler, INHALE 2 PUFFS INTO THE LUNGS EVERY 4 (FOUR) HOURS AS NEEDED FOR WHEEZING OR SHORTNESS OF BREATH., Disp: 18 each, Rfl: 5   Ferrous Sulfate (IRON CR PO), Take 1 each by mouth daily., Disp: , Rfl:    ferrous sulfate 325 (65  FE) MG tablet, Take by mouth., Disp: , Rfl:    omeprazole (PRILOSEC) 40 MG capsule, Take 1 capsule (40 mg total) by mouth daily., Disp: 30 capsule, Rfl: 11   promethazine-dextromethorphan (PROMETHAZINE-DM) 6.25-15 MG/5ML syrup, Take 5 mLs by mouth 3 (three) times daily as needed for cough., Disp: 118 mL, Rfl: 0   STIOLTO RESPIMAT 2.5-2.5 MCG/ACT AERS, INHALE 2 PUFFS BY MOUTH INTO THE LUNGS DAILY, Disp: 4 g, Rfl: 3   tadalafil (CIALIS) 20 MG tablet, TAKE 1 TABLET BY MOUTH EVERY DAY AS NEEDED FOR ERECTILE DYSFUNCTION, Disp: 10 tablet, Rfl: 5   Tiotropium Bromide-Olodaterol (STIOLTO RESPIMAT) 2.5-2.5 MCG/ACT AERS, Inhale 2 puffs into the lungs daily., Disp: 4 g, Rfl: 0  EXAM:  VITALS per patient if applicable:  GENERAL: alert, oriented, appears well and in no acute distress  HEENT: atraumatic, conjunttiva clear, no obvious abnormalities on inspection of external nose and ears  NECK: normal movements of the head and neck  LUNGS: on inspection no signs of respiratory distress, breathing rate appears normal, no obvious gross SOB, gasping or wheezing  CV: no obvious cyanosis  MS: moves all visible extremities without noticeable abnormality  PSYCH/NEURO: pleasant and cooperative, no obvious depression or anxiety, speech and thought processing grossly intact  ASSESSMENT AND PLAN:  Discussed the following assessment and plan:  COVID-19  -we discussed possible serious and likely etiologies, options for evaluation and workup, limitations of telemedicine visit vs in person visit, treatment, treatment risks and precautions. Pt is agreeable to treatment via telemedicine at this moment. Discussed treatment, treatment window, potential complications, contagious period, precautions with patient and  his wife. He is out of treatment window for antiviral. Opted for rx for cough and symptomatic care per patient instructions.Denies any breathing difficulty above baseline with this illness, fever has resolved  and is doing better except for a lingering cough per his report. Advised to seek prompt in person care if worsening, new symptoms arise, or if is not improving with treatment. Discussed options for inperson care if PCP office not available. Did let this patient know that I only do telemedicine on Tuesdays and Thursdays for [place]. Advised to schedule follow up visit with PCP or [facility] if any further questions or concerns to avoid delays in care.   I discussed the assessment and treatment plan with the patient. The patient was provided an opportunity to ask questions and all were answered. The patient agreed with the plan and demonstrated an understanding of the instructions.     Lucretia Kern, DO

## 2021-04-10 NOTE — Patient Instructions (Signed)
-  I sent the medication(s) we discussed to your pharmacy: Meds ordered this encounter  Medications   promethazine-dextromethorphan (PROMETHAZINE-DM) 6.25-15 MG/5ML syrup    Sig: Take 5 mLs by mouth 3 (three) times daily as needed for cough.    Dispense:  118 mL    Refill:  0     I hope you are feeling better soon!  Seek in person care promptly if your symptoms worsen, new concerns arise or you are not improving with treatment.  It was nice to meet you today. I help [facility] out with telemedicine visits on Tuesdays and Thursdays and am happy to help if you need a virtual follow up visit on those days. Otherwise, if you have any concerns or questions following this visit please schedule a follow up visit with your Primary Care office or seek care at a local urgent care clinic to avoid delays in care

## 2021-04-14 DIAGNOSIS — Z20822 Contact with and (suspected) exposure to covid-19: Secondary | ICD-10-CM | POA: Diagnosis not present

## 2021-04-14 DIAGNOSIS — Z03818 Encounter for observation for suspected exposure to other biological agents ruled out: Secondary | ICD-10-CM | POA: Diagnosis not present

## 2021-04-30 ENCOUNTER — Other Ambulatory Visit: Payer: Self-pay

## 2021-04-30 ENCOUNTER — Encounter: Payer: Self-pay | Admitting: Emergency Medicine

## 2021-04-30 ENCOUNTER — Ambulatory Visit: Payer: Medicare Other | Admitting: Emergency Medicine

## 2021-04-30 DIAGNOSIS — J439 Emphysema, unspecified: Secondary | ICD-10-CM

## 2021-04-30 MED ORDER — STIOLTO RESPIMAT 2.5-2.5 MCG/ACT IN AERS: 2.0000 | INHALATION_SPRAY | Freq: Every day | RESPIRATORY_TRACT | 0 refills | Status: DC

## 2021-04-30 NOTE — Progress Notes (Signed)
Subjective:    Patient ID: Guy Carlson., male    DOB: 08/18/1955, 66 y.o.   MRN: [id number]  Shortness of Breath Pertinent negatives include no ear pain, fever, headaches, leg swelling, rash, rhinorrhea, sore throat, vomiting or wheezing.   ROV 07/14/19 --66 year old man with severe obstruction by spirometry, severe COPD who follows up today.  At his last visit in October we tried getting him on Stiolto as his stable bronchodilator regimen.  He had tried Trelegy in the past but did not think it was as good as his previous Advair.  He took the samples I gave him, he has not been getting reliably since retirement, has not tried to pick it up from pharmacy - unclear how much it is going to cost.  He reports that he has not been using the stiolto, the longest he used it was for a week. He feels that his breathing does not always require it. He uses ventolin more > not every day, but a few times a week. Minimal cough, sputum. Took covid vaccine recently #1.   ROV 05/17/20 --follow-up visit for 66 year old gentleman with a history of severe COPD.  We have been having difficulty getting him on a stable bronchodilator regimen due to cost.  He has most recently been on Stiolto, has Ventolin which he uses as needed. He reports that he has been doing well. 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 He is able to walk, exert, does better in the cold months. Uses albuterol about once a day. He has some nasal congestion especially with dust exposure, no real cough. He can get SOB when he "moves too fast". Reliable with his Stiolto at this time.  COVID vaccine up to date, does not want the flu shot  ROV 04/30/21 --66 year old man with a history of severe COPD.  Last seen 1 year ago.  We have been managing him on Stiolto although at times had trouble getting a reliable supply.  He had upper respiratory infection and was treated for but acute exacerbation in mid  November.  Tested positive for COVID 3 weeks ago >> had fatigue, chest congestion and cough. He was treated with tessalon, was outside treatment window for anti-virals. He is improved now. Has been reliable with Stiolto. Minimal albuterol use. He is back to work.   He doesn't want flu shot, wants to get most recent covid booster 90 days after this infxn.    Review of Systems  Constitutional:  Negative for fever and unexpected weight change.  HENT:  Positive for congestion. Negative for dental problem, ear pain, nosebleeds, postnasal drip, rhinorrhea, sinus pressure, sneezing, sore throat and trouble swallowing.   Eyes:  Negative for redness and itching.  Respiratory:  Positive for shortness of breath. Negative for cough, chest tightness and wheezing.   Cardiovascular:  Negative for palpitations and leg swelling.  Gastrointestinal:  Negative for nausea and vomiting.  Genitourinary:  Negative for dysuria.  Musculoskeletal:  Negative for joint swelling.  Skin:  Negative for rash.  Neurological:  Negative for headaches.  Hematological:  Does not bruise/bleed easily.  Psychiatric/Behavioral:  Negative for dysphoric mood. The patient is not nervous/anxious.        Objective:   Physical Exam  Vitals:   04/30/21 0908  BP: 124/72  Pulse: 85  Temp: 98.4 F (36.9 C)  TempSrc: Oral  SpO2: 98%  Weight: 153 lb (69.4 kg)  Height: 5'  11.5" (1.816 m)   Gen: Pleasant, thin, in no distress,  normal affect  ENT: No lesions,  mouth clear,  oropharynx clear, no postnasal drip  Neck: No JVD, no stridor  Lungs: No use of accessory muscles, very distant, no wheeze or crackles for a normal breath, he does have some end expiratory wheezing on forced expiration  Cardiovascular: RRR, heart sounds normal, no murmur or gallops, no peripheral edema  Musculoskeletal: No deformities, no cyanosis or clubbing  Neuro: alert, non focal  Skin: Warm, no lesions or rash     Assessment & Plan:  COPD  (chronic obstructive pulmonary disease) (HCC) Recent COVID-19, did not need to be treated with steroids or antibiotics at that time.  I did treat him for an acute exacerbation in November.  He does not seem to be flaring frequently.  If flare frequency does increase then we would need to consider adding back an ICS.  Right now he is being managed with Stiolto.  Minimal albuterol use.  He does not want a flu shot but is planning to get the COVID-19 booster 90 days after his recent positive test.  Please continue Stiolto 2 puffs once daily as you have been taking it. Keep your albuterol available use 2 puffs when you needed for shortness of breath, chest tightness, wheezing. Agree that you should get the COVID-19 booster about 90 days after your recent positive test. There are resources available about the different inhaled medications at [email or web address].  Follow with Dr. Lamonte Sakai in 12 months or sooner if you have any problems.   Baltazar Apo, MD, PhD 04/30/2021, 9:34 AM [facility] [phone] or if no answer [phone]

## 2021-04-30 NOTE — Assessment & Plan Note (Signed)
Recent COVID-19, did not need to be treated with steroids or antibiotics at that time.  I did treat him for an acute exacerbation in November.  He does not seem to be flaring frequently.  If flare frequency does increase then we would need to consider adding back an ICS.  Right now he is being managed with Stiolto.  Minimal albuterol use.  He does not want a flu shot but is planning to get the COVID-19 booster 90 days after his recent positive test.  Please continue Stiolto 2 puffs once daily as you have been taking it. Keep your albuterol available use 2 puffs when you needed for shortness of breath, chest tightness, wheezing. Agree that you should get the COVID-19 booster about 90 days after your recent positive test. There are resources available about the different inhaled medications at [email or web address].  Follow with Dr. Lamonte Sakai in 12 months or sooner if you have any problems.

## 2021-04-30 NOTE — Patient Instructions (Addendum)
Please continue Stiolto 2 puffs once daily as you have been taking it. Keep your albuterol available use 2 puffs when you needed for shortness of breath, chest tightness, wheezing. Agree that you should get the COVID-19 booster about 90 days after your recent positive test. There are resources available about the different inhaled medications at [email or web address].  Follow with Dr. Lamonte Sakai in 12 months or sooner if you have any problems.

## 2021-04-30 NOTE — Addendum Note (Signed)
Addended by: Gavin Potters R on: 04/30/2021 10:07 AM   Modules accepted: Orders

## 2021-07-21 ENCOUNTER — Telehealth: Payer: Self-pay | Admitting: Family Medicine

## 2021-07-21 NOTE — Telephone Encounter (Signed)
Left message for patient to call back and schedule Medicare Annual Wellness Visit (AWV) either virtually or in office. Left  my jabber number [phone]    awvi 10/26/19 per [facility]  ; please schedule at anytime with [facility] Nurse Health Advisor 1 or 2   This should be a 45 minute visit.  

## 2021-09-19 ENCOUNTER — Encounter: Payer: Self-pay | Admitting: Family Medicine

## 2021-09-19 ENCOUNTER — Ambulatory Visit: Payer: Medicare Other | Admitting: Family Medicine

## 2021-09-19 VITALS — BP 120/78 | HR 68 | Temp 98.0°F | Ht 71.5 in | Wt 155.2 lb

## 2021-09-19 DIAGNOSIS — K219 Gastro-esophageal reflux disease without esophagitis: Secondary | ICD-10-CM

## 2021-09-19 DIAGNOSIS — N401 Enlarged prostate with lower urinary tract symptoms: Secondary | ICD-10-CM | POA: Diagnosis not present

## 2021-09-19 DIAGNOSIS — N528 Other male erectile dysfunction: Secondary | ICD-10-CM

## 2021-09-19 DIAGNOSIS — E785 Hyperlipidemia, unspecified: Secondary | ICD-10-CM

## 2021-09-19 DIAGNOSIS — R739 Hyperglycemia, unspecified: Secondary | ICD-10-CM | POA: Diagnosis not present

## 2021-09-19 DIAGNOSIS — N138 Other obstructive and reflux uropathy: Secondary | ICD-10-CM

## 2021-09-19 DIAGNOSIS — J439 Emphysema, unspecified: Secondary | ICD-10-CM

## 2021-09-19 LAB — CBC WITH DIFFERENTIAL/PLATELET
Basophils Absolute: 0 10*3/uL (ref 0.0–0.1)
Basophils Relative: 0.9 % (ref 0.0–3.0)
Eosinophils Absolute: 0.2 10*3/uL (ref 0.0–0.7)
Eosinophils Relative: 5.5 % — ABNORMAL HIGH (ref 0.0–5.0)
HCT: 41.8 % (ref 39.0–52.0)
Hemoglobin: 13.6 g/dL (ref 13.0–17.0)
Lymphocytes Relative: 19.8 % (ref 12.0–46.0)
Lymphs Abs: 0.9 10*3/uL (ref 0.7–4.0)
MCHC: 32.6 g/dL (ref 30.0–36.0)
MCV: 90.1 fl (ref 78.0–100.0)
Monocytes Absolute: 0.4 10*3/uL (ref 0.1–1.0)
Monocytes Relative: 8.8 % (ref 3.0–12.0)
Neutro Abs: 2.9 10*3/uL (ref 1.4–7.7)
Neutrophils Relative %: 65 % (ref 43.0–77.0)
Platelets: 190 10*3/uL (ref 150.0–400.0)
RBC: 4.63 Mil/uL (ref 4.22–5.81)
RDW: 13.2 % (ref 11.5–15.5)
WBC: 4.4 10*3/uL (ref 4.0–10.5)

## 2021-09-19 LAB — LIPID PANEL
Cholesterol: 203 mg/dL — ABNORMAL HIGH (ref 0–200)
HDL: 50 mg/dL (ref >39.00)
LDL Cholesterol: 139 mg/dL — ABNORMAL HIGH (ref 0–99)
NonHDL: 152.56
Total CHOL/HDL Ratio: 4
Triglycerides: 68 mg/dL (ref 0.0–149.0)
VLDL: 13.6 mg/dL (ref 0.0–40.0)

## 2021-09-19 LAB — HEPATIC FUNCTION PANEL
ALT: 10 U/L (ref 0–53)
AST: 14 U/L (ref 0–37)
Albumin: 4.3 g/dL (ref 3.5–5.2)
Alkaline Phosphatase: 54 U/L (ref 39–117)
Bilirubin, Direct: 0.2 mg/dL (ref 0.0–0.3)
Total Bilirubin: 0.8 mg/dL (ref 0.2–1.2)
Total Protein: 6.7 g/dL (ref 6.0–8.3)

## 2021-09-19 LAB — HEMOGLOBIN A1C: Hgb A1c MFr Bld: 5.1 % (ref 4.6–6.5)

## 2021-09-19 LAB — BASIC METABOLIC PANEL
BUN: 19 mg/dL (ref 6–23)
CO2: 29 mEq/L (ref 19–32)
Calcium: 9.8 mg/dL (ref 8.4–10.5)
Chloride: 101 mEq/L (ref 96–112)
Creatinine, Ser: 1.15 mg/dL (ref 0.40–1.50)
GFR: 65.71 mL/min (ref >60.00)
Glucose, Bld: 88 mg/dL (ref 70–99)
Potassium: 4.4 mEq/L (ref 3.5–5.1)
Sodium: 135 mEq/L (ref 135–145)

## 2021-09-19 LAB — PSA: PSA: 0.44 ng/mL (ref 0.10–4.00)

## 2021-09-19 LAB — TSH: TSH: 1.8 u[IU]/mL (ref 0.35–5.50)

## 2021-09-19 MED ORDER — TADALAFIL 20 MG PO TABS: ORAL_TABLET | ORAL | 11 refills | Status: DC

## 2021-09-19 NOTE — Progress Notes (Signed)
Subjective:    Patient ID: Guy Shrout., male    DOB: December 25, 1955, 66 y.o.   MRN: [id number]  HPI Here to follow up on issues. He is doing well overall. The COPD is somewhat limiting, but he stays active. He cut his grass yesterday. He has been getting relief with Stiolto. He saw Dr. Lamonte Sakai in January. His BP is stable. His GERD is stable.    Review of Systems  Constitutional: Negative.   HENT: Negative.    Eyes: Negative.   Respiratory:  Positive for shortness of breath. Negative for cough and wheezing.   Cardiovascular: Negative.   Gastrointestinal: Negative.   Genitourinary: Negative.   Musculoskeletal: Negative.   Skin: Negative.   Neurological: Negative.   Psychiatric/Behavioral: Negative.        Objective:   Physical Exam Constitutional:      General: He is not in acute distress.    Appearance: Normal appearance. He is well-developed. He is not diaphoretic.  HENT:     Head: Normocephalic and atraumatic.     Right Ear: External ear normal.     Left Ear: External ear normal.     Nose: Nose normal.     Mouth/Throat:     Pharynx: No oropharyngeal exudate.  Eyes:     General: No scleral icterus.       Right eye: No discharge.        Left eye: No discharge.     Conjunctiva/sclera: Conjunctivae normal.     Pupils: Pupils are equal, round, and reactive to light.  Neck:     Thyroid: No thyromegaly.     Vascular: No JVD.     Trachea: No tracheal deviation.  Cardiovascular:     Rate and Rhythm: Normal rate and regular rhythm.     Heart sounds: Normal heart sounds. No murmur heard.   No friction rub. No gallop.  Pulmonary:     Effort: Pulmonary effort is normal. No respiratory distress.     Breath sounds: Normal breath sounds. No wheezing or rales.  Chest:     Chest wall: No tenderness.  Abdominal:     General: Bowel sounds are normal. There is no distension.     Palpations: Abdomen is soft. There is no mass.     Tenderness: There is no abdominal  tenderness. There is no guarding or rebound.  Genitourinary:    Penis: No tenderness.   Musculoskeletal:        General: No tenderness. Normal range of motion.     Cervical back: Neck supple.  Lymphadenopathy:     Cervical: No cervical adenopathy.  Skin:    General: Skin is warm and dry.     Coloration: Skin is not pale.     Findings: No erythema or rash.  Neurological:     Mental Status: He is alert and oriented to person, place, and time.     Cranial Nerves: No cranial nerve deficit.     Motor: No abnormal muscle tone.     Coordination: Coordination normal.     Deep Tendon Reflexes: Reflexes are normal and symmetric. Reflexes normal.  Psychiatric:        Behavior: Behavior normal.        Thought Content: Thought content normal.        Judgment: Judgment normal.          Assessment & Plan:  He is doing well as far as COPD, GERD, and ED. We will get fasting labs today  to check lipids, etc. We spent a total of ( 33  ) minutes reviewing records and discussing these issues.  [name], MD

## 2021-09-22 ENCOUNTER — Other Ambulatory Visit: Payer: Self-pay | Admitting: Adult Health

## 2021-09-24 ENCOUNTER — Ambulatory Visit: Payer: Medicare Other

## 2021-10-01 ENCOUNTER — Telehealth: Payer: Self-pay | Admitting: Family Medicine

## 2021-10-01 NOTE — Telephone Encounter (Signed)
Left message for patient to call back and schedule Medicare Annual Wellness Visit (AWV) either virtually or in office. Left  my jabber number [phone]    awvi 10/26/19 per [facility]  ; please schedule at anytime with [facility] Nurse Health Advisor 1 or 2   This should be a 45 minute visit.  

## 2021-11-10 ENCOUNTER — Telehealth: Payer: Self-pay | Admitting: Family Medicine

## 2021-11-10 NOTE — Telephone Encounter (Signed)
Left message for patient to call back and schedule Medicare Annual Wellness Visit (AWV) either virtually or in office. Left  my Herbie Drape number [phone]    awvi 10/26/19 per [facility]  ; please schedule at anytime with [facility] Nurse Health Advisor 1 or 2   This should be a 45 minute visit.

## 2021-12-10 ENCOUNTER — Telehealth: Payer: Self-pay | Admitting: Family Medicine

## 2021-12-10 NOTE — Telephone Encounter (Signed)
Left message for patient to call back and schedule Medicare Annual Wellness Visit (AWV) either virtually or in office. Left  my Guy Carlson number [phone]   awvi 10/26/19 per [facility]   please schedule at anytime with [facility] Nurse Health Advisor 1 or 2   This should be a 45 minute visit.

## 2021-12-24 ENCOUNTER — Other Ambulatory Visit: Payer: Self-pay | Admitting: Emergency Medicine

## 2021-12-25 ENCOUNTER — Other Ambulatory Visit: Payer: Self-pay | Admitting: *Deleted

## 2021-12-25 MED ORDER — ALBUTEROL SULFATE HFA 108 (90 BASE) MCG/ACT IN AERS
2.0000 | INHALATION_SPRAY | RESPIRATORY_TRACT | 5 refills | Status: DC | PRN
Start: 2021-12-25 — End: 2022-12-07

## 2022-01-07 ENCOUNTER — Telehealth: Payer: Self-pay | Admitting: Family Medicine

## 2022-01-07 NOTE — Telephone Encounter (Signed)
Left message for patient to call back and schedule Medicare Annual Wellness Visit (AWV) either virtually or in office. Left  my Herbie Drape number [phone]    awvi 10/26/19 per [facility]  please schedule at anytime with [facility] Nurse Health Advisor 1 or 2   This should be a 45 minute visit.

## 2022-01-23 ENCOUNTER — Telehealth: Payer: Self-pay | Admitting: Family Medicine

## 2022-01-23 NOTE — Telephone Encounter (Signed)
Left message for patient to call back and schedule Medicare Annual Wellness Visit (AWV) either virtually or in office. Left  my jabber number [phone]    awvi 10/26/19 per [facility]  please schedule with Nurse Health Adviser   45 min for awv-i and in office appointments 30 min for awv-s  phone/virtual appointments  

## 2022-03-04 ENCOUNTER — Telehealth: Payer: Self-pay | Admitting: Family Medicine

## 2022-03-04 NOTE — Telephone Encounter (Signed)
Left message for patient to call back and schedule Medicare Annual Wellness Visit (AWV) either virtually or in office. Left  my Guy Carlson number [phone]   awvi 10/26/19 per [facility]  please schedule with Nurse Health Adviser   45 min for awv-i and in office appointments 30 min for awv-s  phone/virtual appointments

## 2022-04-03 ENCOUNTER — Telehealth: Payer: Self-pay | Admitting: Family Medicine

## 2022-04-03 NOTE — Telephone Encounter (Signed)
Left message for patient to call back and schedule Medicare Annual Wellness Visit (AWV) either virtually or in office. Left  my Herbie Drape number [phone]    awvi 10/26/19 per [facility]  please schedule with Nurse Health Adviser   45 min for awv-i and in office appointments 30 min for awv-s  phone/virtual appointments

## 2022-06-11 ENCOUNTER — Ambulatory Visit: Payer: Medicare Other | Admitting: Emergency Medicine

## 2022-06-11 ENCOUNTER — Encounter: Payer: Self-pay | Admitting: Emergency Medicine

## 2022-06-11 VITALS — BP 118/58 | HR 79 | Temp 98.4°F | Ht 71.5 in | Wt 156.6 lb

## 2022-06-11 DIAGNOSIS — J439 Emphysema, unspecified: Secondary | ICD-10-CM

## 2022-06-11 MED ORDER — BREZTRI AEROSPHERE 160-9-4.8 MCG/ACT IN AERO: 2.0000 | INHALATION_SPRAY | Freq: Two times a day (BID) | RESPIRATORY_TRACT | 0 refills | Status: DC

## 2022-06-11 NOTE — Assessment & Plan Note (Signed)
Remains active but he is having more symptoms than 1 year ago.  He wonders if he should try an alternative Stiolto and I think that this is reasonable.  I will add an ICS to his LABA/LAMA, trial him on Breztri to see if he gets more benefit.  He has been on Trelegy in the past and did not benefit from it.  He will let us know if he wants [place] to send a prescription for Judithann Sauger to his pharmacy or whether he wants to stay with the [facility].  Discussed vaccines with him today, he would like to get any shots if at all possible.  He is avoiding getting the flu shot, COVID shot etc.  We will temporarily stop Stiolto. Try taking Breztri 2 puffs twice a day.  Remember to rinse and gargle after using.  Keep track of if this medication helps you.  Please call us to let us know if you would like for [place] to send a prescription of Breztri to your pharmacy or whether you would like to stay with the [place]. Keep your albuterol available to use 2 puffs when you needed for shortness of breath, chest tightness, wheezing. Follow Dr. Lamonte Sakai in 1 year or sooner if you have any problems.

## 2022-06-11 NOTE — Patient Instructions (Addendum)
We will temporarily stop Stiolto. Try taking Breztri 2 puffs twice a day.  Remember to rinse and gargle after using.  Keep track of if this medication helps you.  Please call us to let us know if you would like for [place] to send a prescription of Breztri to your pharmacy or whether you would like to stay with the [place]. Keep your albuterol available to use 2 puffs when you needed for shortness of breath, chest tightness, wheezing. Follow Dr. Lamonte Sakai in 1 year or sooner if you have any problems.

## 2022-06-11 NOTE — Progress Notes (Signed)
Subjective:    Patient ID: Guy Clem., male    DOB: May 16, 1955, 67 y.o.   MRN: [id number]  Shortness of Breath Pertinent negatives include no ear pain, fever, headaches, leg swelling, rash, rhinorrhea, sore throat, vomiting or wheezing.   ROV 04/30/21 --67 year old man with a history of severe COPD.  Last seen 1 year ago.  We have been managing him on Stiolto although at times had trouble getting a reliable supply.  He had upper respiratory infection and was treated for but acute exacerbation in mid November.  Tested positive for COVID 3 weeks ago >> had fatigue, chest congestion and cough. He was treated with tessalon, was outside treatment window for anti-virals. He is improved now. Has been reliable with Stiolto. Minimal albuterol use. He is back to work.   He doesn't want flu shot, wants to get most recent covid booster 90 days after this infxn.   ROV 06/11/22 --follow-up visit 67 year old male with a history of severe COPD.  He also has a history of COVID-19 in early 2023.  We have been managing him with Stiolto although he was having some trouble getting this medication reliably.   Today he reports: He remains active, works 2 days a week. Has albuterol and uses about 2-3x a week He can have some SOB w stairs, after eating. He is on Stiolto, takes reliably. He feels that his breathing is slightly worse than last year.  He doesn't want any shots.   Review of Systems  Constitutional:  Negative for fever and unexpected weight change.  HENT:  Positive for congestion. Negative for dental problem, ear pain, nosebleeds, postnasal drip, rhinorrhea, sinus pressure, sneezing, sore throat and trouble swallowing.   Eyes:  Negative for redness and itching.  Respiratory:  Positive for shortness of breath. Negative for cough, chest tightness and wheezing.   Cardiovascular:  Negative for palpitations and leg swelling.  Gastrointestinal:  Negative for nausea and vomiting.  Genitourinary:   Negative for dysuria.  Musculoskeletal:  Negative for joint swelling.  Skin:  Negative for rash.  Neurological:  Negative for headaches.  Hematological:  Does not bruise/bleed easily.  Psychiatric/Behavioral:  Negative for dysphoric mood. The patient is not nervous/anxious.         Objective:   Physical Exam  Vitals:   06/11/22 1603  BP: (!) 118/58  Pulse: 79  Temp: 98.4 F (36.9 C)  TempSrc: Oral  SpO2: 99%  Weight: 156 lb 9.6 oz (71 kg)  Height: 5' 11.5" (1.816 m)   Gen: Pleasant, thin, in no distress,  normal affect  ENT: No lesions,  mouth clear,  oropharynx clear, no postnasal drip  Neck: No JVD, no stridor  Lungs: No use of accessory muscles, very distant, no wheeze or crackles for a normal breath, he does have some end expiratory wheezing on forced expiration  Cardiovascular: RRR, heart sounds normal, no murmur or gallops, no peripheral edema  Musculoskeletal: No deformities, no cyanosis or clubbing  Neuro: alert, non focal  Skin: Warm, no lesions or rash     Assessment & Plan:  COPD (chronic obstructive pulmonary disease) (HCC) Remains active but he is having more symptoms than 1 year ago.  He wonders if he should try an alternative Stiolto and I think that this is reasonable.  I will add an ICS to his LABA/LAMA, trial him on Breztri to see if he gets more benefit.  He has been on Trelegy in the past and did not benefit from it.  He will let us know if he wants [place] to send a prescription for Judithann Sauger to his pharmacy or whether he wants to stay with the [place].  Discussed vaccines with him today, he would like to get any shots if at all possible.  He is avoiding getting the flu shot, COVID shot etc.  We will temporarily stop Stiolto. Try taking Breztri 2 puffs twice a day.  Remember to rinse and gargle after using.  Keep track of if this medication helps you.  Please call us to let us know if you would like for [place] to send a prescription of Breztri to your pharmacy  or whether you would like to stay with the [place]. Keep your albuterol available to use 2 puffs when you needed for shortness of breath, chest tightness, wheezing. Follow Dr. Lamonte Sakai in 1 year or sooner if you have any problems.  Baltazar Apo, MD, PhD 06/11/2022, 4:37 PM [facility] [phone] or if no answer [phone]

## 2022-06-17 ENCOUNTER — Telehealth: Payer: Self-pay | Admitting: Family Medicine

## 2022-06-17 NOTE — Telephone Encounter (Signed)
Called patient to schedule Medicare Annual Wellness Visit (AWV). Left message for patient to call back and schedule Medicare Annual Wellness Visit (AWV).  Last date of AWV:  due   awvi 10/26/19 per [facility]   Please schedule an appointment at any time with [facility] or [facility].  If any questions, please contact me at [phone].  Thank you ,  Barkley Boards AWV direct phone # [phone]

## 2022-07-25 ENCOUNTER — Other Ambulatory Visit: Payer: Self-pay | Admitting: Emergency Medicine

## 2022-09-08 ENCOUNTER — Other Ambulatory Visit: Payer: Self-pay | Admitting: Emergency Medicine

## 2022-11-22 ENCOUNTER — Other Ambulatory Visit: Payer: Self-pay | Admitting: Emergency Medicine

## 2022-12-04 ENCOUNTER — Other Ambulatory Visit: Payer: Self-pay | Admitting: Emergency Medicine

## 2022-12-07 ENCOUNTER — Telehealth: Payer: Self-pay | Admitting: Emergency Medicine

## 2022-12-11 MED ORDER — ALBUTEROL SULFATE HFA 108 (90 BASE) MCG/ACT IN AERS: 2.0000 | INHALATION_SPRAY | RESPIRATORY_TRACT | 1 refills | Status: DC

## 2022-12-11 NOTE — Telephone Encounter (Signed)
Updated order placed.

## 2022-12-11 NOTE — Telephone Encounter (Signed)
Please indicate that maximum puffs = 10 / day

## 2023-02-03 ENCOUNTER — Encounter: Payer: Self-pay | Admitting: Gastroenterology

## 2023-02-05 ENCOUNTER — Other Ambulatory Visit: Payer: Self-pay | Admitting: Emergency Medicine

## 2023-03-05 ENCOUNTER — Encounter: Payer: Self-pay | Admitting: Gastroenterology

## 2023-04-06 ENCOUNTER — Encounter: Payer: Self-pay | Admitting: Family Medicine

## 2023-04-06 ENCOUNTER — Ambulatory Visit (INDEPENDENT_AMBULATORY_CARE_PROVIDER_SITE_OTHER): Payer: Medicare Other | Admitting: Family Medicine

## 2023-04-06 VITALS — BP 104/62 | HR 80 | Temp 98.1°F | Wt 157.2 lb

## 2023-04-06 DIAGNOSIS — R0989 Other specified symptoms and signs involving the circulatory and respiratory systems: Secondary | ICD-10-CM | POA: Diagnosis not present

## 2023-04-06 DIAGNOSIS — R059 Cough, unspecified: Secondary | ICD-10-CM

## 2023-04-06 DIAGNOSIS — J40 Bronchitis, not specified as acute or chronic: Secondary | ICD-10-CM

## 2023-04-06 LAB — POC COVID19 BINAXNOW: SARS Coronavirus 2 Ag: NEGATIVE

## 2023-04-06 MED ORDER — AZITHROMYCIN 250 MG PO TABS: ORAL_TABLET | ORAL | 0 refills | Status: DC

## 2023-04-06 NOTE — Progress Notes (Signed)
   Subjective:    Patient ID: Guy Onderko., male    DOB: [birth year], 67 y.o.   MRN: [id number]  HPI Here for one week of ST, PND, and coughing up yellow sputum.    Review of Systems  Constitutional: Negative.   HENT:  Positive for congestion, postnasal drip and sore throat. Negative for ear pain and sinus pressure.   Eyes: Negative.   Respiratory:  Positive for cough. Negative for shortness of breath and wheezing.        Objective:   Physical Exam Constitutional:      Appearance: Normal appearance. He is not ill-appearing.  HENT:     Right Ear: Tympanic membrane, ear canal and external ear normal.     Left Ear: Tympanic membrane, ear canal and external ear normal.     Nose: Nose normal.     Mouth/Throat:     Pharynx: Oropharynx is clear.  Eyes:     Conjunctiva/sclera: Conjunctivae normal.  Pulmonary:     Effort: Pulmonary effort is normal.     Breath sounds: Normal breath sounds.  Neurological:     Mental Status: He is alert.           Assessment & Plan:  Bronchitis, treat with a Zpack.  Gershon Crane, MD

## 2023-04-09 ENCOUNTER — Other Ambulatory Visit: Payer: Self-pay | Admitting: Emergency Medicine

## 2023-04-15 ENCOUNTER — Other Ambulatory Visit: Payer: Self-pay | Admitting: Emergency Medicine

## 2023-05-31 ENCOUNTER — Ambulatory Visit (INDEPENDENT_AMBULATORY_CARE_PROVIDER_SITE_OTHER): Payer: Medicare Other | Admitting: Family Medicine

## 2023-05-31 ENCOUNTER — Encounter: Payer: Self-pay | Admitting: Family Medicine

## 2023-05-31 VITALS — BP 100/60 | HR 79 | Temp 98.2°F | Ht 70.0 in | Wt 156.0 lb

## 2023-05-31 DIAGNOSIS — N401 Enlarged prostate with lower urinary tract symptoms: Secondary | ICD-10-CM | POA: Diagnosis not present

## 2023-05-31 DIAGNOSIS — N138 Other obstructive and reflux uropathy: Secondary | ICD-10-CM

## 2023-05-31 DIAGNOSIS — R739 Hyperglycemia, unspecified: Secondary | ICD-10-CM | POA: Diagnosis not present

## 2023-05-31 DIAGNOSIS — G4700 Insomnia, unspecified: Secondary | ICD-10-CM

## 2023-05-31 DIAGNOSIS — E785 Hyperlipidemia, unspecified: Secondary | ICD-10-CM | POA: Diagnosis not present

## 2023-05-31 DIAGNOSIS — L309 Dermatitis, unspecified: Secondary | ICD-10-CM | POA: Diagnosis not present

## 2023-05-31 DIAGNOSIS — N528 Other male erectile dysfunction: Secondary | ICD-10-CM

## 2023-05-31 DIAGNOSIS — K219 Gastro-esophageal reflux disease without esophagitis: Secondary | ICD-10-CM

## 2023-05-31 DIAGNOSIS — J439 Emphysema, unspecified: Secondary | ICD-10-CM | POA: Diagnosis not present

## 2023-05-31 LAB — BASIC METABOLIC PANEL
BUN: 10 mg/dL (ref 6–23)
CO2: 29 meq/L (ref 19–32)
Calcium: 9.2 mg/dL (ref 8.4–10.5)
Chloride: 102 meq/L (ref 96–112)
Creatinine, Ser: 1.03 mg/dL (ref 0.40–1.50)
GFR: 74.12 mL/min (ref >60.00)
Glucose, Bld: 85 mg/dL (ref 70–99)
Potassium: 4 meq/L (ref 3.5–5.1)
Sodium: 138 meq/L (ref 135–145)

## 2023-05-31 LAB — PSA: PSA: 0.62 ng/mL (ref 0.10–4.00)

## 2023-05-31 LAB — LIPID PANEL
Cholesterol: 201 mg/dL — ABNORMAL HIGH (ref 0–200)
HDL: 49 mg/dL (ref >39.00)
LDL Cholesterol: 137 mg/dL — ABNORMAL HIGH (ref 0–99)
NonHDL: 151.84
Total CHOL/HDL Ratio: 4
Triglycerides: 73 mg/dL (ref 0.0–149.0)
VLDL: 14.6 mg/dL (ref 0.0–40.0)

## 2023-05-31 LAB — CBC WITH DIFFERENTIAL/PLATELET
Basophils Absolute: 0 10*3/uL (ref 0.0–0.1)
Basophils Relative: 0.9 % (ref 0.0–3.0)
Eosinophils Absolute: 0.2 10*3/uL (ref 0.0–0.7)
Eosinophils Relative: 4.4 % (ref 0.0–5.0)
HCT: 42.1 % (ref 39.0–52.0)
Hemoglobin: 13.5 g/dL (ref 13.0–17.0)
Lymphocytes Relative: 17.2 % (ref 12.0–46.0)
Lymphs Abs: 0.8 10*3/uL (ref 0.7–4.0)
MCHC: 32.1 g/dL (ref 30.0–36.0)
MCV: 91.1 fL (ref 78.0–100.0)
Monocytes Absolute: 0.3 10*3/uL (ref 0.1–1.0)
Monocytes Relative: 6.4 % (ref 3.0–12.0)
Neutro Abs: 3.2 10*3/uL (ref 1.4–7.7)
Neutrophils Relative %: 71.1 % (ref 43.0–77.0)
Platelets: 182 10*3/uL (ref 150.0–400.0)
RBC: 4.62 Mil/uL (ref 4.22–5.81)
RDW: 13.5 % (ref 11.5–15.5)
WBC: 4.6 10*3/uL (ref 4.0–10.5)

## 2023-05-31 LAB — HEPATIC FUNCTION PANEL
ALT: 12 U/L (ref 0–53)
AST: 15 U/L (ref 0–37)
Albumin: 4.2 g/dL (ref 3.5–5.2)
Alkaline Phosphatase: 50 U/L (ref 39–117)
Bilirubin, Direct: 0.1 mg/dL (ref 0.0–0.3)
Total Bilirubin: 0.8 mg/dL (ref 0.2–1.2)
Total Protein: 6.6 g/dL (ref 6.0–8.3)

## 2023-05-31 LAB — HEMOGLOBIN A1C: Hgb A1c MFr Bld: 5.1 % (ref 4.6–6.5)

## 2023-05-31 LAB — TSH: TSH: 1.52 u[IU]/mL (ref 0.35–5.50)

## 2023-05-31 MED ORDER — SILDENAFIL CITRATE 100 MG PO TABS: 100.0000 mg | ORAL_TABLET | Freq: Every day | ORAL | 5 refills | Status: AC | PRN

## 2023-05-31 NOTE — Progress Notes (Signed)
Subjective:    Patient ID: Guy Carlson., male    DOB: 1955/06/23, 68 y.o.   MRN: [id number]  HPI Here to follow up on issues. He sees Dr. Delton Coombes regularly for COPD. He has been using Cialis for ED, but he has not been getting much results from this. He has been sleeping well, and his GERD is stable.    Review of Systems  Constitutional: Negative.   HENT: Negative.    Eyes: Negative.   Respiratory: Negative.    Cardiovascular: Negative.   Gastrointestinal: Negative.   Genitourinary: Negative.   Musculoskeletal: Negative.   Skin: Negative.   Neurological: Negative.   Psychiatric/Behavioral: Negative.         Objective:   Physical Exam Constitutional:      General: He is not in acute distress.    Appearance: Normal appearance. He is well-developed. He is not diaphoretic.  HENT:     Head: Normocephalic and atraumatic.     Right Ear: External ear normal.     Left Ear: External ear normal.     Nose: Nose normal.     Mouth/Throat:     Pharynx: No oropharyngeal exudate.  Eyes:     General: No scleral icterus.       Right eye: No discharge.        Left eye: No discharge.     Conjunctiva/sclera: Conjunctivae normal.     Pupils: Pupils are equal, round, and reactive to light.  Neck:     Thyroid: No thyromegaly.     Vascular: No JVD.     Trachea: No tracheal deviation.  Cardiovascular:     Rate and Rhythm: Normal rate and regular rhythm.     Pulses: Normal pulses.     Heart sounds: Normal heart sounds. No murmur heard.    No friction rub. No gallop.  Pulmonary:     Effort: Pulmonary effort is normal. No respiratory distress.     Breath sounds: Normal breath sounds. No wheezing or rales.  Chest:     Chest wall: No tenderness.  Abdominal:     General: Bowel sounds are normal. There is no distension.     Palpations: Abdomen is soft. There is no mass.     Tenderness: There is no abdominal tenderness. There is no guarding or rebound.  Genitourinary:    Penis:  Normal. No tenderness.      Testes: Normal.     Prostate: Normal.     Rectum: Normal. Guaiac result negative.  Musculoskeletal:        General: No tenderness. Normal range of motion.     Cervical back: Neck supple.  Lymphadenopathy:     Cervical: No cervical adenopathy.  Skin:    General: Skin is warm and dry.     Coloration: Skin is not pale.     Findings: No erythema or rash.  Neurological:     General: No focal deficit present.     Mental Status: He is alert and oriented to person, place, and time.     Cranial Nerves: No cranial nerve deficit.     Motor: No abnormal muscle tone.     Coordination: Coordination normal.     Deep Tendon Reflexes: Reflexes are normal and symmetric. Reflexes normal.  Psychiatric:        Mood and Affect: Mood normal.        Behavior: Behavior normal.        Thought Content: Thought content normal.  Judgment: Judgment normal.           Assessment & Plan:  His COPD and GERD are stable. For the ED, he will stop Cialis and he will try Viagra instead. His insomnia is well controlled. Get fasting labs to check lipids, an A1c, etc. We spent a total of ( 35  ) minutes reviewing records and discussing these issues.  Gershon Crane, MD

## 2023-06-04 ENCOUNTER — Other Ambulatory Visit: Payer: Self-pay | Admitting: Emergency Medicine

## 2023-06-18 ENCOUNTER — Other Ambulatory Visit: Payer: Self-pay | Admitting: Emergency Medicine

## 2023-06-23 DIAGNOSIS — G249 Dystonia, unspecified: Secondary | ICD-10-CM | POA: Diagnosis not present

## 2023-06-23 DIAGNOSIS — I498 Other specified cardiac arrhythmias: Secondary | ICD-10-CM | POA: Diagnosis not present

## 2023-06-30 DIAGNOSIS — I498 Other specified cardiac arrhythmias: Secondary | ICD-10-CM | POA: Diagnosis not present

## 2023-06-30 DIAGNOSIS — G249 Dystonia, unspecified: Secondary | ICD-10-CM | POA: Diagnosis not present

## 2023-07-29 ENCOUNTER — Encounter: Payer: Self-pay | Admitting: Emergency Medicine

## 2023-07-29 ENCOUNTER — Ambulatory Visit: Payer: Medicare Other | Admitting: Emergency Medicine

## 2023-07-29 VITALS — BP 122/74 | HR 91 | Ht 71.0 in | Wt 157.4 lb

## 2023-07-29 DIAGNOSIS — J439 Emphysema, unspecified: Secondary | ICD-10-CM

## 2023-07-29 NOTE — Progress Notes (Signed)
 Subjective:    Patient ID: Guy Shin., male    DOB: 07-Jul-1955, 68 y.o.   MRN: [id number]  Shortness of Breath Pertinent negatives include no ear pain, fever, headaches, leg swelling, rash, rhinorrhea, sore throat, vomiting or wheezing.   ROV 06/11/22 --follow-up visit 68 year old male with a history of severe COPD.  He also has a history of COVID-19 in early 2023.  We have been managing him with Stiolto although he was having some trouble getting this medication reliably.   Today he reports: He remains active, works 2 days a week. Has albuterol and uses about 2-3x a week He can have some SOB w stairs, after eating. He is on Stiolto, takes reliably. He feels that his breathing is slightly worse than last year.  He doesn't want any shots.   ROV 07/29/23 --68 year old gentleman with severe COPD, COVID-19 in 2023.  He had been managed on Stiolto, changed to [facility] 05/2022 to see if he will get more benefit.  He did not notice any benefit so he went back to [employer] beginning.  He is using albuterol approximately daily. Often before exertion.  He has SOB w 15 stairs, with carrying laundry. Has been more bothersome with the warm weather. 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 Minimal cough. He has dry mouth. He wants to avoid O2 if possible. He does not want any vaccines.   Review of Systems  Constitutional:  Negative for fever and unexpected weight change.  HENT:  Positive for congestion. Negative for dental problem, ear pain, nosebleeds, postnasal drip, rhinorrhea, sinus pressure, sneezing, sore throat and trouble swallowing.   Eyes:  Negative for redness and itching.  Respiratory:  Positive for shortness of breath. Negative for cough, chest tightness and wheezing.   Cardiovascular:  Negative for palpitations and leg swelling.  Gastrointestinal:  Negative for nausea and vomiting.  Genitourinary:  Negative for dysuria.  Musculoskeletal:  Negative for joint swelling.  Skin:   Negative for rash.  Neurological:  Negative for headaches.  Hematological:  Does not bruise/bleed easily.  Psychiatric/Behavioral:  Negative for dysphoric mood. The patient is not nervous/anxious.        Objective:   Physical Exam  Vitals:   07/29/23 0909  BP: 122/74  Pulse: 91  SpO2: 98%  Weight: 157 lb 6.4 oz (71.4 kg)  Height: 5\' 11"  (1.803 m)   Gen: Pleasant, thin, in no distress,  normal affect  ENT: No lesions,  mouth clear,  oropharynx clear, no postnasal drip  Neck: No JVD, no stridor  Lungs: No use of accessory muscles, very distant, no wheeze or crackles for a normal breath, he does have some end expiratory wheezing on forced expiration  Cardiovascular: RRR, heart sounds normal, no murmur or gallops, no peripheral edema  Musculoskeletal: No deformities, no cyanosis or clubbing  Neuro: alert, non focal  Skin: Warm, no lesions or rash     Assessment & Plan:  COPD (chronic obstructive pulmonary disease) (HCC) He has severe obstruction but remains quite active, still works.  He does have noticeable shortness of breath, asked today if there was something better to help him than Stiolto.  Actually think that probably LABA/LAMA is the best BD for him.  He did not benefit when we added ICS Markus Daft).  Plan to continue the Stiolto.  We will check on a walking oximetry today to ensure that he does not desaturate, would benefit from supplemental O2.  He does not like to get vaccines.  I did recommend that he reconsider the flu shot next fall.   Levy Pupa, MD, PhD 07/29/2023, 9:26 AM [facility] [phone] or if no answer [phone]

## 2023-07-29 NOTE — Patient Instructions (Addendum)
 We will perform a walking oximetry today on room air Please continue your Stiolto 2 puffs once daily. Keep your albuterol available to use 2 puffs when needed for shortness of breath, chest tightness, wheezing.  You should try using 2 puffs about 15 minutes prior to exercise Consider getting the flu shot next fall Follow with Dr. Delton Coombes in 12 months or sooner if you have any problems.

## 2023-07-29 NOTE — Assessment & Plan Note (Signed)
 He has severe obstruction but remains quite active, still works.  He does have noticeable shortness of breath, asked today if there was something better to help him than Stiolto.  Actually think that probably LABA/LAMA is the best BD for him.  He did not benefit when we added ICS Markus Daft).  Plan to continue the Stiolto.  We will check on a walking oximetry today to ensure that he does not desaturate, would benefit from supplemental O2.  He does not like to get vaccines.  I did recommend that he reconsider the flu shot next fall.

## 2023-09-04 ENCOUNTER — Other Ambulatory Visit: Payer: Self-pay | Admitting: Emergency Medicine

## 2023-09-06 DIAGNOSIS — R414 Neurologic neglect syndrome: Secondary | ICD-10-CM | POA: Diagnosis not present

## 2023-10-11 DIAGNOSIS — R414 Neurologic neglect syndrome: Secondary | ICD-10-CM | POA: Diagnosis not present

## 2023-10-13 ENCOUNTER — Other Ambulatory Visit: Payer: Self-pay | Admitting: Emergency Medicine

## 2023-11-11 ENCOUNTER — Encounter: Payer: Self-pay | Admitting: Internal Medicine

## 2023-11-11 ENCOUNTER — Ambulatory Visit (INDEPENDENT_AMBULATORY_CARE_PROVIDER_SITE_OTHER): Admitting: Internal Medicine

## 2023-11-11 VITALS — BP 110/70 | HR 100 | Temp 99.3°F | Wt 152.4 lb

## 2023-11-11 DIAGNOSIS — U071 COVID-19: Secondary | ICD-10-CM | POA: Diagnosis not present

## 2023-11-11 DIAGNOSIS — R059 Cough, unspecified: Secondary | ICD-10-CM | POA: Diagnosis not present

## 2023-11-11 LAB — POCT INFLUENZA A/B
Influenza A, POC: NEGATIVE
Influenza B, POC: NEGATIVE

## 2023-11-11 LAB — POC COVID19 BINAXNOW: SARS Coronavirus 2 Ag: POSITIVE — AB

## 2023-11-11 MED ORDER — NIRMATRELVIR/RITONAVIR (PAXLOVID)TABLET: 3.0000 | ORAL_TABLET | Freq: Two times a day (BID) | ORAL | 0 refills | Status: AC

## 2023-11-11 NOTE — Progress Notes (Signed)
 Established Patient Office Visit     CC/Reason for Visit: Cough, runny nose and postnasal drip  HPI: Guy Carlson. is a 68 y.o. male who is coming in today for the above mentioned reasons.  Symptoms have been present for a little over 24 hours.  No fever, no shortness of breath.  As far as he knows no sick contacts, no recent travel.   Past Medical/Surgical History: Past Medical History:  Diagnosis Date   COPD (chronic obstructive pulmonary disease) (HCC)    sees Dr. Lamar Chris    GERD (gastroesophageal reflux disease)    Hyperlipidemia     Past Surgical History:  Procedure Laterality Date   COLONOSCOPY  02/09/2018   per Dr. Aneita, benign polyps, repeat in 5 yrs (previous hx of adenomas)     Social History:  reports that he quit smoking about 11 years ago. His smoking use included cigarettes. He started smoking about 41 years ago. He has a 45 pack-year smoking history. He has never used smokeless tobacco. He reports that he does not drink alcohol and does not use drugs.  Allergies: No Known Allergies  Family History:  Family History  Problem Relation Age of Onset   Diabetes Father    Kidney disease Father    Diabetes Mother    Mental illness Sister        committed suicide   HIV Brother        died of AIDS   Breast cancer Maternal Aunt        x2   Ovarian cancer Sister    Heart disease Maternal Aunt    Heart disease Paternal Aunt    Colon cancer Neg Hx    Esophageal cancer Neg Hx    Rectal cancer Neg Hx    Stomach cancer Neg Hx      Current Outpatient Medications:    albuterol  (VENTOLIN  HFA) 108 (90 Base) MCG/ACT inhaler, INHALE 2 PUFFS BY MOUTH INTO THE LUNGS WHEN NEEDED AS DIRECTED FOR SHORTNESS OF BREATH, CHEST TIGHTNESS, OR WHEEZING, Disp: 18 g, Rfl: 0   Ferrous Sulfate (IRON CR PO), Take 1 each by mouth daily., Disp: , Rfl:    nirmatrelvir /ritonavir  (PAXLOVID ) 20 x 150 MG & 10 x 100MG  TABS, Take 3 tablets by mouth 2 (two) times daily  for 5 days. (Take nirmatrelvir  150 mg two tablets twice daily for 5 days and ritonavir  100 mg one tablet twice daily for 5 days) Patient GFR is 74, Disp: 30 tablet, Rfl: 0   sildenafil  (VIAGRA ) 100 MG tablet, Take 1 tablet (100 mg total) by mouth daily as needed for erectile dysfunction., Disp: 10 tablet, Rfl: 5   STIOLTO RESPIMAT  2.5-2.5 MCG/ACT AERS, INHALE 2 PUFFS BY MOUTH ONCE DAILY, Disp: 8 g, Rfl: 0  Review of Systems:  Negative unless indicated in HPI.   Physical Exam: Vitals:   11/11/23 0901  BP: 110/70  Pulse: 100  Temp: 99.3 F (37.4 C)  TempSrc: Oral  SpO2: 98%  Weight: 152 lb 6.4 oz (69.1 kg)    Body mass index is 21.26 kg/m.   Physical Exam Vitals reviewed.  Constitutional:      Appearance: Normal appearance.  HENT:     Right Ear: Tympanic membrane, ear canal and external ear normal.     Left Ear: Tympanic membrane, ear canal and external ear normal.     Mouth/Throat:     Mouth: Mucous membranes are moist.     Pharynx: Oropharynx is clear.  Eyes:     Conjunctiva/sclera: Conjunctivae normal.     Pupils: Pupils are equal, round, and reactive to light.  Cardiovascular:     Rate and Rhythm: Normal rate and regular rhythm.  Pulmonary:     Effort: Pulmonary effort is normal.     Breath sounds: Normal breath sounds.  Neurological:     Mental Status: He is alert.      Impression and Plan:  COVID-19 -     nirmatrelvir /ritonavir ; Take 3 tablets by mouth 2 (two) times daily for 5 days. (Take nirmatrelvir  150 mg two tablets twice daily for 5 days and ritonavir  100 mg one tablet twice daily for 5 days) Patient GFR is 74  Dispense: 30 tablet; Refill: 0  Cough, unspecified type -     POCT Influenza A/B -     POC COVID-19 BinaxNow   - In office COVID test is positive, influenza negative. -He may also use OTC medications such as antihistamines, decongestants, pain relievers, guaifenesin. -We have reviewed quarantine period of 5 days. -We have discussed symptoms  that would promote ED evaluation. -He knows to follow with us  if symptoms fail to resolve.   Time spent:23 minutes reviewing chart, interviewing and examining patient and formulating plan of care.     Tully Theophilus Andrews, MD [facility]

## 2023-11-16 ENCOUNTER — Other Ambulatory Visit: Payer: Self-pay | Admitting: Emergency Medicine

## 2023-12-01 ENCOUNTER — Encounter: Payer: Self-pay | Admitting: Gastroenterology

## 2023-12-01 ENCOUNTER — Ambulatory Visit (AMBULATORY_SURGERY_CENTER)

## 2023-12-01 VITALS — Ht 71.0 in | Wt 165.0 lb

## 2023-12-01 DIAGNOSIS — Z860100 Personal history of colon polyps, unspecified: Secondary | ICD-10-CM

## 2023-12-01 MED ORDER — NA SULFATE-K SULFATE-MG SULF 17.5-3.13-1.6 GM/177ML PO SOLN: 1.0000 | Freq: Once | ORAL | 0 refills | Status: AC

## 2023-12-01 NOTE — Progress Notes (Signed)

## 2023-12-15 ENCOUNTER — Telehealth: Payer: Self-pay | Admitting: *Deleted

## 2023-12-15 ENCOUNTER — Telehealth: Payer: Self-pay

## 2023-12-15 NOTE — Telephone Encounter (Signed)
 Dr. Legrand,  This pt is scheduled with you on 12/20/23.  The pulmonologist's note from his last visit indicates he has severe pulmonary obstructive dz.  His procedure will need to be done at the hospital.  Best regards,  Norleen EMERSON Schillings

## 2023-12-15 NOTE — Telephone Encounter (Signed)
 Please see note from Norleen Schillings, CRNA that due to this patient's severe COPD, his colonoscopy scheduled for 12/20/23 with me cannot be done in the [facility].  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  I don't think the patient has yet been informed of this cancellation, so please contact him today with that news and place him on a waiting list for hospital outpatient procedures that can be done by next available physician with an open slot.  - H. Danis

## 2023-12-15 NOTE — Telephone Encounter (Signed)
 Pre-operative Risk Assessment     Request for pulmonary clearance for  Endoscopy Procedure  What is being performed?     colonoscopy  When is this scheduled?     To be decided once cleared  What type of clearance is required ?   Pulmonary  Are there any medications that need to be held prior to procedure and how long? no  Practice name and name of physician performing surgery?      [facility]  What is your office phone and fax number?      Phone- [phone]  Fax- [phone]  Anesthesia type (None, local, MAC, general) ?       MAC   Please route your response to Almarie Goo, LPN

## 2023-12-15 NOTE — Telephone Encounter (Signed)
 Called patient and left message requesting a return call. Request for pulmonary clearance routed to Dr Shelah. Colonoscopy canceled at [facility] for 12/24/23.

## 2023-12-16 NOTE — Telephone Encounter (Signed)
 Pulmonary clearance received. No barriers for colonoscopy unless he is having a flare of his COPD, moderate risk for MAC sedation.

## 2023-12-16 NOTE — Telephone Encounter (Signed)
 He has severe COPD but remains quite functional.  He is at moderate risk for MAC sedation.  No barriers to proceeding as long as he is not having flaring symptoms.  If he is flaring (wheeze, cough, sputum production, bronchitic symptoms) then his procedure needs to be postponed and rescheduled.

## 2023-12-16 NOTE — Telephone Encounter (Signed)
 Left message on the voicemail for the patient to return our call.

## 2023-12-20 ENCOUNTER — Encounter (HOSPITAL_COMMUNITY): Payer: Self-pay | Admitting: Gastroenterology

## 2023-12-20 ENCOUNTER — Ambulatory Visit (HOSPITAL_COMMUNITY): Admitting: Certified Registered Nurse Anesthetist

## 2023-12-20 ENCOUNTER — Ambulatory Visit (HOSPITAL_COMMUNITY)
Admission: RE | Admit: 2023-12-20 | Discharge: 2023-12-20 | Disposition: A | Discharge status: Home / Self Care | Source: Ambulatory Visit | Attending: Gastroenterology | Admitting: Gastroenterology

## 2023-12-20 ENCOUNTER — Other Ambulatory Visit: Payer: Self-pay

## 2023-12-20 ENCOUNTER — Ambulatory Visit (HOSPITAL_BASED_OUTPATIENT_CLINIC_OR_DEPARTMENT_OTHER): Admitting: Certified Registered Nurse Anesthetist

## 2023-12-20 ENCOUNTER — Encounter: Admitting: Gastroenterology

## 2023-12-20 ENCOUNTER — Encounter (HOSPITAL_COMMUNITY)
Admission: RE | Disposition: A | Discharge status: Home / Self Care | Payer: Self-pay | Source: Ambulatory Visit | Attending: Gastroenterology

## 2023-12-20 DIAGNOSIS — Z860101 Personal history of adenomatous and serrated colon polyps: Secondary | ICD-10-CM | POA: Diagnosis not present

## 2023-12-20 DIAGNOSIS — K5520 Angiodysplasia of colon without hemorrhage: Secondary | ICD-10-CM | POA: Insufficient documentation

## 2023-12-20 DIAGNOSIS — D125 Benign neoplasm of sigmoid colon: Secondary | ICD-10-CM | POA: Insufficient documentation

## 2023-12-20 DIAGNOSIS — K219 Gastro-esophageal reflux disease without esophagitis: Secondary | ICD-10-CM | POA: Diagnosis not present

## 2023-12-20 DIAGNOSIS — J449 Chronic obstructive pulmonary disease, unspecified: Secondary | ICD-10-CM | POA: Insufficient documentation

## 2023-12-20 DIAGNOSIS — K626 Ulcer of anus and rectum: Secondary | ICD-10-CM

## 2023-12-20 DIAGNOSIS — Z1211 Encounter for screening for malignant neoplasm of colon: Secondary | ICD-10-CM | POA: Diagnosis not present

## 2023-12-20 DIAGNOSIS — K635 Polyp of colon: Secondary | ICD-10-CM

## 2023-12-20 DIAGNOSIS — Z860100 Personal history of colon polyps, unspecified: Secondary | ICD-10-CM | POA: Diagnosis not present

## 2023-12-20 DIAGNOSIS — K5730 Diverticulosis of large intestine without perforation or abscess without bleeding: Secondary | ICD-10-CM

## 2023-12-20 DIAGNOSIS — K6289 Other specified diseases of anus and rectum: Secondary | ICD-10-CM | POA: Diagnosis not present

## 2023-12-20 DIAGNOSIS — K648 Other hemorrhoids: Secondary | ICD-10-CM | POA: Diagnosis not present

## 2023-12-20 DIAGNOSIS — Z87891 Personal history of nicotine dependence: Secondary | ICD-10-CM | POA: Diagnosis not present

## 2023-12-20 HISTORY — PX: COLONOSCOPY: SHX5424

## 2023-12-20 SURGERY — COLONOSCOPY
Anesthesia: Monitor Anesthesia Care

## 2023-12-20 MED ORDER — SODIUM CHLORIDE 0.9 % IV SOLN: INTRAVENOUS | Status: DC

## 2023-12-20 MED ORDER — PROPOFOL 10 MG/ML IV BOLUS
INTRAVENOUS | Status: DC | PRN
  Administered 2023-12-20: 30 mg via INTRAVENOUS

## 2023-12-20 MED ORDER — PROPOFOL 500 MG/50ML IV EMUL
INTRAVENOUS | Status: DC | PRN
  Administered 2023-12-20: 125 ug/kg/min via INTRAVENOUS

## 2023-12-20 NOTE — Interval H&P Note (Signed)
 History and Physical Interval Note:  12/20/2023 10:23 AM  Guy LELON Slade Sr.  has presented today for surgery, with the diagnosis of hx polyps.  The various methods of treatment have been discussed with the patient and family. After consideration of risks, benefits and other options for treatment, the patient has consented to  Procedure(s): COLONOSCOPY (N/A) as a surgical intervention.  The patient's history has been reviewed, patient examined, no change in status, stable for surgery.  I have reviewed the patient's chart and labs.  Questions were answered to the patient's satisfaction.     Sandor GAILS Caiya Bettes

## 2023-12-20 NOTE — Anesthesia Preprocedure Evaluation (Signed)
 Anesthesia Evaluation  Patient identified by MRN, date of birth, ID band Patient awake    Reviewed: Allergy & Precautions, NPO status , Patient's Chart, lab work & pertinent test results  Airway Mallampati: II  TM Distance: >3 FB Neck ROM: Full    Dental  (+) Dental Advisory Given   Pulmonary COPD,  COPD inhaler, former smoker   breath sounds clear to auscultation       Cardiovascular negative cardio ROS  Rhythm:Regular Rate:Normal     Neuro/Psych negative neurological ROS     GI/Hepatic Neg liver ROS,GERD  ,,  Endo/Other  negative endocrine ROS    Renal/GU negative Renal ROS     Musculoskeletal   Abdominal   Peds  Hematology negative hematology ROS (+)   Anesthesia Other Findings   Reproductive/Obstetrics                              Anesthesia Physical Anesthesia Plan  ASA: 3  Anesthesia Plan: MAC   Post-op Pain Management:    Induction:   PONV Risk Score and Plan: 1 and Propofol  infusion  Airway Management Planned: Natural Airway and Simple Face Mask  Additional Equipment:   Intra-op Plan:   Post-operative Plan:   Informed Consent: I have reviewed the patients History and Physical, chart, labs and discussed the procedure including the risks, benefits and alternatives for the proposed anesthesia with the patient or authorized representative who has indicated his/her understanding and acceptance.       Plan Discussed with: CRNA  Anesthesia Plan Comments:         Anesthesia Quick Evaluation

## 2023-12-20 NOTE — H&P (Signed)
 GASTROENTEROLOGY PROCEDURE H&P NOTE   Primary Care Physician: Johnny Garnette LABOR, MD    Reason for Procedure:  Colon polyp surveillance  Plan:    Colonoscopy   Patient is appropriate for endoscopic procedure(s) at [facility].  The nature of the procedure, as well as the risks, benefits, and alternatives were carefully and thoroughly reviewed with the patient. Ample time for discussion and questions allowed. The patient understood, was satisfied, and agreed to proceed.     HPI: Guy Carlson. is a 68 y.o. male who presents for colonoscopy for ongoing polyp surveillance.  -07/2012: Colonoscopy: 4 mm cecal AVM, 1 cm pedunculated polyp removed from sigmoid colon (sessile serrated adenoma), left-sided diverticulosis, internal hemorrhoids - 01/2018: Colonoscopy: 3 small 4-5 mm benign polyps removed from the sigmoid and descending colon, ascending colon AVM, left-sided diverticulosis, internal hemorrhoids.  Recommended repeat in 5 years  Patient was initially scheduled to be done in the [facility] this morning, but due to severe COPD, patient transferred over to [facility] unit for colonoscopy due to elevated periprocedural risks.  Otherwise completed prep without issue.  No anticoagulation or antiplatelet therapy.  Past Medical History:  Diagnosis Date   COPD (chronic obstructive pulmonary disease) (HCC)    sees Dr. Lamar Chris    GERD (gastroesophageal reflux disease)    Hyperlipidemia     Past Surgical History:  Procedure Laterality Date   COLONOSCOPY  02/09/2018   per Dr. Aneita, benign polyps, repeat in 5 yrs (previous hx of adenomas)     Prior to Admission medications   Medication Sig Start Date End Date Taking? Authorizing Provider  albuterol  (VENTOLIN  HFA) 108 (90 Base) MCG/ACT inhaler INHALE 2 PUFFS BY MOUTH INTO THE LUNGS WHEN NEEDED AS DIRECTED FOR SHORTNESS OF BREATH, CHEST TIGHTNESS, OR WHEEZING 10/14/23  Yes Byrum, Robert S, MD   Ferrous Sulfate (IRON CR PO) Take 1 each by mouth daily.   Yes [provider]  sildenafil  (VIAGRA ) 100 MG tablet Take 1 tablet (100 mg total) by mouth daily as needed for erectile dysfunction. 05/31/23  Yes Johnny Garnette LABOR, MD  STIOLTO RESPIMAT  2.5-2.5 MCG/ACT AERS INHALE 2 PUFFS BY MOUTH ONCE DAILY 11/16/23  Yes Chris Lamar RAMAN, MD    Current Facility-Administered Medications  Medication Dose Route Frequency Provider Last Rate Last Admin   0.9 %  sodium chloride  infusion   Intravenous Continuous Marylon Verno V, DO 20 mL/hr at 12/20/23 0927 New Bag at 12/20/23 0927    Allergies as of 12/20/2023   (No Known Allergies)    Family History  Problem Relation Age of Onset   Diabetes Father    Kidney disease Father    Diabetes Mother    Mental illness Sister        committed suicide   HIV Brother        died of AIDS   Breast cancer Maternal Aunt        x2   Ovarian cancer Sister    Heart disease Maternal Aunt    Heart disease Paternal Aunt    Colon cancer Neg Hx    Esophageal cancer Neg Hx    Rectal cancer Neg Hx    Stomach cancer Neg Hx     Social History   Socioeconomic History   Marital status: Married    Spouse name: Not on file   Number of children: 3   Years of education: Not on file   Highest education level: Not  on file  Occupational History    Employer: [employer]  Tobacco Use   Smoking status: Former    Current packs/day: 0.00    Average packs/day: 1.5 packs/day for 30.0 years (45.0 ttl pk-yrs)    Types: Cigarettes    Start date: 06/16/1982    Quit date: 06/16/2012    Years since quitting: 11.5   Smokeless tobacco: Never  Vaping Use   Vaping status: Never Used  Substance and Sexual Activity   Alcohol use: No    Alcohol/week: 0.0 standard drinks of alcohol   Drug use: No   Sexual activity: Not on file  Other Topics Concern   Not on file  Social History Narrative   Not on file   Social Drivers of Health   Financial Resource Strain:  Not on file  Food Insecurity: Not on file  Transportation Needs: Not on file  Physical Activity: Not on file  Stress: Not on file  Social Connections: Not on file  Intimate Partner Violence: Not on file    Physical Exam: Vital signs in last 24 hours: @BP  (!) 126/93   Pulse 93   Temp 97.7 F (36.5 C) (Temporal)   Resp 15   Ht 5' 11 (1.803 m)   Wt 71.2 kg   SpO2 99%   BMI 21.90 kg/m  GEN: NAD EYE: Sclerae anicteric ENT: MMM CV: Non-tachycardic Pulm: CTA b/l GI: Soft, NT/ND NEURO:  Alert & Oriented x 3   Sandor Flatter, DO [facility]   12/20/2023 9:52 AM

## 2023-12-20 NOTE — Anesthesia Procedure Notes (Signed)
 Procedure Name: MAC Date/Time: 12/20/2023 10:39 AM  Performed by: Judythe Tanda Aran, CRNAPre-anesthesia Checklist: Patient identified, Emergency Drugs available, Suction available and Patient being monitored Patient Re-evaluated:Patient Re-evaluated prior to induction Oxygen Delivery Method: Simple face mask

## 2023-12-20 NOTE — Anesthesia Postprocedure Evaluation (Signed)
 Anesthesia Post Note  Patient: Guy Carlson.  Procedure(s) Performed: COLONOSCOPY     Patient location during evaluation: PACU Anesthesia Type: MAC Level of consciousness: awake and alert Pain management: pain level controlled Vital Signs Assessment: post-procedure vital signs reviewed and stable Respiratory status: spontaneous breathing, nonlabored ventilation, respiratory function stable and patient connected to nasal cannula oxygen Cardiovascular status: stable and blood pressure returned to baseline Postop Assessment: no apparent nausea or vomiting Anesthetic complications: no   No notable events documented.  Last Vitals:  Vitals:   12/20/23 1120 12/20/23 1131  BP: 102/78 (!) 103/58  Pulse: 78 78  Resp: 14 19  Temp:    SpO2: 98% 98%    Last Pain:  Vitals:   12/20/23 1131  TempSrc:   PainSc: 0-No pain                 Epifanio Lamar BRAVO

## 2023-12-20 NOTE — Op Note (Signed)
 [facility] Patient Name: Guy Carlson Procedure Date: 12/20/2023 MRN: [id number] Attending MD: Sandor Flatter , MD, [id number] Date of Birth: 1955-07-05 CSN: [id number] Age: 68 Admit Type: Outpatient Procedure:                Colonoscopy Indications:              High risk colon cancer surveillance: Personal                            history of colonic polyps                           ?" 07/2012: Colonoscopy: 4 mm cecal AVM, 1 cm                            pedunculated polyp removed from sigmoid colon                            (sessile serrated adenoma), left-sided                            diverticulosis, internal hemorrhoids                           ?" 01/2018: Colonoscopy: 3 small 4-5 mm benign                            polyps removed from the sigmoid and descending                            colon, ascending colon AVM, left-sided                            diverticulosis, internal hemorrhoids. Recommended                            repeat in 5 years Providers:                Sandor Flatter, MD, Almarie Masters, RN, Felice Sar, Technician Referring MD:              Medicines:                Monitored Anesthesia Care Complications:            No immediate complications. Estimated Blood Loss:     Estimated blood loss was minimal. Procedure:                Pre-Anesthesia Assessment:                           - Prior to the procedure, a History and Physical                            was performed, and patient medications and  allergies were reviewed. The patient's tolerance of                            previous anesthesia was also reviewed. The risks                            and benefits of the procedure and the sedation                            options and risks were discussed with the patient.                            All questions were answered, and informed consent                             was obtained. Prior Anticoagulants: The patient has                            taken no anticoagulant or antiplatelet agents. ASA                            Grade Assessment: III - A patient with severe                            systemic disease. After reviewing the risks and                            benefits, the patient was deemed in satisfactory                            condition to undergo the procedure.                           After obtaining informed consent, the colonoscope                            was passed under direct vision. Throughout the                            procedure, the patient's blood pressure, pulse, and                            oxygen saturations were monitored continuously. The                            CF-HQ190L ([id number] Olympus colonoscope was                            introduced through the anus and advanced to the the                            cecum, identified by appendiceal orifice and  ileocecal valve. The colonoscopy was performed                            without difficulty. The patient tolerated the                            procedure well. The quality of the bowel                            preparation was good. The ileocecal valve,                            appendiceal orifice, and rectum were photographed. Scope In: 10:42:44 AM Scope Out: 11:03:08 AM Scope Withdrawal Time: 0 hours 13 minutes 7 seconds  Total Procedure Duration: 0 hours 20 minutes 24 seconds  Findings:      The perianal and digital rectal examinations were normal.      A 2 mm polyp was found in the sigmoid colon. The polyp was sessile. The       polyp was removed with a cold biopsy forceps. Resection and retrieval       were complete. Estimated blood loss was minimal.      Multiple medium-mouthed and small-mouthed diverticula were found in the       sigmoid colon and descending colon.      A single small angioectasia with typical  arborization was found in the       ascending colon.      Two five mm ulcers were found in the proximal and distal rectum. No       bleeding was present. Biopsies were taken with a cold forceps for       histology. Estimated blood loss was minimal. Impression:               - One 2 mm polyp in the sigmoid colon, removed with                            a cold biopsy forceps. Resected and retrieved.                           - Diverticulosis in the sigmoid colon and in the                            descending colon.                           - A single colonic angioectasia.                           - Two ulcers in the rectum. Biopsied. Moderate Sedation:      Not Applicable - Patient had care per Anesthesia. Recommendation:           - Patient has a contact number available for                            emergencies. The signs and symptoms of potential  delayed complications were discussed with the                            patient. Return to normal activities tomorrow.                            Written discharge instructions were provided to the                            patient.                           - Resume previous diet.                           - Continue present medications.                           - Await pathology results.                           - Repeat colonoscopy for surveillance based on                            pathology results.                           - Return to GI office PRN. Procedure Code(s):        --- Professional ---                           [phone], Colonoscopy, flexible; with biopsy, single                            or multiple Diagnosis Code(s):        --- Professional ---                           Z86.010, Personal history of colonic polyps                           D12.5, Benign neoplasm of sigmoid colon                           K55.20, Angiodysplasia of colon without hemorrhage                           K62.6,  Ulcer of anus and rectum                           K57.30, Diverticulosis of large intestine without                            perforation or abscess without bleeding CPT copyright 2022 [employer]. All rights reserved. The codes documented in this report are preliminary and upon coder review may  be revised to meet current compliance requirements. Sandor Flatter, MD 12/20/2023 11:11:29 AM Number  of Addenda: 0

## 2023-12-20 NOTE — Transfer of Care (Signed)
 Immediate Anesthesia Transfer of Care Note  Patient: Guy COVELLI Sr.  Procedure(s) Performed: COLONOSCOPY  Patient Location: Endoscopy Unit  Anesthesia Type:MAC  Level of Consciousness: drowsy  Airway & Oxygen Therapy: Patient Spontanous Breathing and Patient connected to face mask  Post-op Assessment: Report given to RN and Post -op Vital signs reviewed and stable  Post vital signs: Reviewed and stable  Last Vitals:  Vitals Value Taken Time  BP    Temp    Pulse    Resp 20 12/20/23 11:08  SpO2    Vitals shown include unfiled device data.  Last Pain:  Vitals:   12/20/23 0918  TempSrc: Temporal  PainSc: 0-No pain         Complications: No notable events documented.

## 2023-12-20 NOTE — Discharge Instructions (Signed)

## 2023-12-21 ENCOUNTER — Encounter (HOSPITAL_COMMUNITY): Payer: Self-pay | Admitting: Gastroenterology

## 2023-12-21 LAB — SURGICAL PATHOLOGY

## 2023-12-23 ENCOUNTER — Ambulatory Visit: Payer: Self-pay | Admitting: Gastroenterology

## 2024-01-06 DIAGNOSIS — H43393 Other vitreous opacities, bilateral: Secondary | ICD-10-CM | POA: Diagnosis not present

## 2024-01-14 DIAGNOSIS — R414 Neurologic neglect syndrome: Secondary | ICD-10-CM | POA: Diagnosis not present

## 2024-01-17 ENCOUNTER — Other Ambulatory Visit: Payer: Self-pay | Admitting: Emergency Medicine

## 2024-03-17 ENCOUNTER — Other Ambulatory Visit: Payer: Self-pay | Admitting: Emergency Medicine

## 2024-04-17 ENCOUNTER — Other Ambulatory Visit: Payer: Self-pay | Admitting: Emergency Medicine

## 2024-04-28 ENCOUNTER — Encounter: Payer: Self-pay | Admitting: *Deleted

## 2024-04-28 ENCOUNTER — Ambulatory Visit: Payer: Self-pay | Admitting: Emergency Medicine

## 2024-04-28 MED ORDER — AZITHROMYCIN 250 MG PO TABS: ORAL_TABLET | ORAL | 0 refills | Status: AC

## 2024-04-28 MED ORDER — PREDNISONE 10 MG PO TABS: ORAL_TABLET | ORAL | 0 refills | Status: AC

## 2024-04-28 NOTE — Telephone Encounter (Signed)
 Ill send in zpack and prednisone  taper for COPD exacerbation/acute bronchitis. Continue Stiolto Rrespimat, albuterol  2 puffs every 4-6 hours and robitussin-dm prn cough

## 2024-04-28 NOTE — Telephone Encounter (Signed)
 FYI Only or Action Required?: Action required by provider: request for appointment.  Patient is followed in Pulmonology for COPD, last seen on 07/29/2023 by Guy Lamar RAMAN, MD.  Called Nurse Triage reporting Cough.  Symptoms began several days ago.  Interventions attempted: OTC medications: mucinex, robitussin, Rescue inhaler, and Maintenance inhaler.  Symptoms are: gradually worsening.  Triage Disposition: See HCP Within 4 Hours (Or PCP Triage)  Patient/caregiver understands and will follow disposition?: No, wishes to speak with PCP        Copied from CRM #[id number]. Topic: Clinical - Red Word Triage >> Apr 28, 2024  8:57 AM Antony Carlson wrote: Red Word that prompted transfer to Nurse Triage: has copd and is coughing and congested with is exacerbating his breathing problems Reason for Disposition  [1] MILD difficulty breathing (e.g., minimal/no SOB at rest, SOB with walking, pulse < 100) AND [2] still present when not coughing  Answer Assessment - Initial Assessment Questions Pt's wife called in saying he picked up something somewhere. She states he has been having worsening shortness of breath for a while, inhalers aren't helping like they usually do. She said last time they picked up his albuterol  pharmacist said he was using it too much as it  was too soon to pick it up. Pt did start coughing on Tuesday. Coughing up mucus but she didn't know what color just stated not green. Shortness of breath only with movement or coughing. Has tried honey and lemon, mucinex, robitussin. Wheezing is worse. RN advised ER/UC due to appt availability. Pt's wife declines stating she has seen people go to those places and end up sicker than when they went in. Pt is requesting an appt. She states that it's not so bad he can't wait until Monday. She is requesting a call back. RN did again give rationale for why pt needs to be seen today, she refused.    1. ONSET: When did the cough begin?       Tuesday 2. SEVERITY: How bad is the cough today?      She states bad 3. SPUTUM: Describe the color of your sputum (e.g., none, dry cough; clear, white, yellow, green)     Not green 4. HEMOPTYSIS: Are you coughing up any blood? If Yes, ask: How much? (e.g., flecks, streaks, tablespoons, etc.)     denies 5. DIFFICULTY BREATHING: Are you having difficulty breathing? If Yes, ask: How bad is it? (e.g., mild, moderate, severe)      Yes with movement, mild 6. FEVER: Do you have a fever? If Yes, ask: What is your temperature, how was it measured, and when did it start?     denies 7. CARDIAC HISTORY: Do you have any history of heart disease? (e.g., heart attack, congestive heart failure)      no 8. LUNG HISTORY: Do you have any history of lung disease?  (e.g., pulmonary embolus, asthma, emphysema)     COPD 9. PE RISK FACTORS: Do you have a history of blood clots? (or: recent major surgery, recent prolonged travel, bedridden)      10. OTHER SYMPTOMS: Do you have any other symptoms? (e.g., runny nose, wheezing, chest pain)       Runny nose, sneezing, wheezing  Protocols used: Cough - Acute Productive-A-AH

## 2024-04-28 NOTE — Telephone Encounter (Signed)
 ATC x1. Went straight to vm. Left detailed vm (DPR) stating Beth's note.

## 2024-04-28 NOTE — Telephone Encounter (Signed)
 ATC x2.  No answer.  Left detailed message per DPR.  Mychart message sent.

## 2024-05-01 NOTE — Telephone Encounter (Signed)
 Called pt again and still no answer- Left detailed msg and closing encounter per protocol.

## 2024-05-05 ENCOUNTER — Telehealth: Payer: Self-pay

## 2024-05-05 MED ORDER — ALBUTEROL SULFATE HFA 108 (90 BASE) MCG/ACT IN AERS: 2.0000 | INHALATION_SPRAY | Freq: Four times a day (QID) | RESPIRATORY_TRACT | 2 refills | Status: AC | PRN

## 2024-05-05 NOTE — Telephone Encounter (Signed)
 Albuterol  refilled

## 2024-05-26 ENCOUNTER — Other Ambulatory Visit: Payer: Self-pay | Admitting: Emergency Medicine
